# Patient Record
Sex: Female | Born: 1960 | Race: White | Hispanic: No | Marital: Married | Smoking: Never smoker
Health system: Southern US, Community
[De-identification: ages and names within clinical notes are randomized; demographics above are authoritative.]

## PROBLEM LIST (undated history)

## (undated) DIAGNOSIS — E119 Type 2 diabetes mellitus without complications: Secondary | ICD-10-CM

## (undated) DIAGNOSIS — M1990 Unspecified osteoarthritis, unspecified site: Secondary | ICD-10-CM

## (undated) DIAGNOSIS — T8859XA Other complications of anesthesia, initial encounter: Secondary | ICD-10-CM

## (undated) DIAGNOSIS — R0600 Dyspnea, unspecified: Secondary | ICD-10-CM

## (undated) DIAGNOSIS — Z87442 Personal history of urinary calculi: Secondary | ICD-10-CM

## (undated) DIAGNOSIS — J45909 Unspecified asthma, uncomplicated: Secondary | ICD-10-CM

## (undated) DIAGNOSIS — F32A Depression, unspecified: Secondary | ICD-10-CM

## (undated) HISTORY — PX: TUBAL LIGATION: SHX77

## (undated) HISTORY — PX: JOINT REPLACEMENT: SHX530

---
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
## 2008-01-12 ENCOUNTER — Ambulatory Visit: Payer: Self-pay | Admitting: Internal Medicine

## 2008-01-12 DIAGNOSIS — J309 Allergic rhinitis, unspecified: Secondary | ICD-10-CM | POA: Insufficient documentation

## 2008-01-12 DIAGNOSIS — R519 Headache, unspecified: Secondary | ICD-10-CM | POA: Insufficient documentation

## 2008-01-12 DIAGNOSIS — J45909 Unspecified asthma, uncomplicated: Secondary | ICD-10-CM | POA: Insufficient documentation

## 2008-01-12 DIAGNOSIS — R51 Headache: Secondary | ICD-10-CM | POA: Insufficient documentation

## 2008-01-18 ENCOUNTER — Ambulatory Visit: Payer: Self-pay | Admitting: Internal Medicine

## 2008-01-18 LAB — CONVERTED CEMR LAB
Basophils Relative: 0.5 % (ref 0.0–1.0)
Eosinophils Absolute: 0.2 10*3/uL (ref 0.0–0.7)
Eosinophils Relative: 2.7 % (ref 0.0–5.0)
HCT: 33.7 % — ABNORMAL LOW (ref 36.0–46.0)
MCV: 81.7 fL (ref 78.0–100.0)
Monocytes Absolute: 0.7 10*3/uL (ref 0.1–1.0)
Monocytes Relative: 8.9 % (ref 3.0–12.0)
Neutrophils Relative %: 72.8 % (ref 43.0–77.0)
RBC: 4.12 M/uL (ref 3.87–5.11)
WBC: 8.4 10*3/uL (ref 4.5–10.5)

## 2008-03-09 ENCOUNTER — Ambulatory Visit: Payer: Self-pay | Admitting: Internal Medicine

## 2008-03-09 ENCOUNTER — Encounter: Payer: Self-pay | Admitting: Internal Medicine

## 2008-03-31 ENCOUNTER — Telehealth (INDEPENDENT_AMBULATORY_CARE_PROVIDER_SITE_OTHER): Payer: Self-pay | Admitting: *Deleted

## 2008-04-05 ENCOUNTER — Telehealth (INDEPENDENT_AMBULATORY_CARE_PROVIDER_SITE_OTHER): Payer: Self-pay | Admitting: *Deleted

## 2008-04-12 ENCOUNTER — Telehealth: Payer: Self-pay | Admitting: Internal Medicine

## 2008-05-17 ENCOUNTER — Ambulatory Visit: Payer: Self-pay | Admitting: Internal Medicine

## 2008-07-18 ENCOUNTER — Telehealth (INDEPENDENT_AMBULATORY_CARE_PROVIDER_SITE_OTHER): Payer: Self-pay | Admitting: *Deleted

## 2008-10-28 ENCOUNTER — Ambulatory Visit: Payer: Self-pay | Admitting: Internal Medicine

## 2008-10-28 ENCOUNTER — Encounter: Payer: Self-pay | Admitting: Adult Health

## 2008-11-18 ENCOUNTER — Ambulatory Visit: Payer: Self-pay | Admitting: Internal Medicine

## 2008-12-06 ENCOUNTER — Telehealth (INDEPENDENT_AMBULATORY_CARE_PROVIDER_SITE_OTHER): Payer: Self-pay

## 2009-08-05 HISTORY — PX: ENDOVEIN HARVEST OF GREATER SAPHENOUS VEIN: SHX5059

## 2009-08-17 ENCOUNTER — Telehealth (INDEPENDENT_AMBULATORY_CARE_PROVIDER_SITE_OTHER): Payer: Self-pay | Admitting: *Deleted

## 2009-12-19 ENCOUNTER — Ambulatory Visit: Payer: Self-pay | Admitting: Internal Medicine

## 2010-01-25 ENCOUNTER — Telehealth (INDEPENDENT_AMBULATORY_CARE_PROVIDER_SITE_OTHER): Payer: Self-pay | Admitting: *Deleted

## 2010-09-04 NOTE — Progress Notes (Signed)
Summary: PRESCRIPT  Phone Note Call from Patient   Caller: Patient Call For: YOUNG Summary of Call: NEED PRESCRIPT FOR ADVAIR 100/50 [facility] [place] Initial call taken by: Rickard Patience,  August 17, 2009 10:24 AM  Follow-up for Phone Call        rx was sent electronic by Florentina Addison, verefied with pharmacy that they have refills for pt Follow-up by: Philipp Deputy CMA,  August 17, 2009 10:26 AM

## 2010-09-04 NOTE — Progress Notes (Signed)
Summary: clarification on meds  Phone Note From Pharmacy Call back at [phone]   Caller: Patient Call For: young Caller: [facility] Call For: young  Summary of Call: need clarification on flovent and advair Initial call taken by: Rickard Patience,  January 25, 2010 2:47 PM  Follow-up for Phone Call        [facility], spoke with Homero Fellers.  Requesting clarrification on advair and flovent.  ? if pt is supposed to be on both of these or just one.  Will forward message to CY-pls advise.  Thanks! Gweneth Dimitri RN  January 25, 2010 3:18 PM   Additional Follow-up for Phone Call Additional follow up Details #1::        Intent was to switch to Flovent. don't need to fill Advair now, but we were going to keep it on our med list as a default that she could get on short notice if flovent won't hold her. If she wants to fill both that is ok if allowed by insurance, but I won't do a prior auth to get both. Additional Follow-up by: Waymon Budge MD,  January 25, 2010 8:10 PM    Additional Follow-up for Phone Call Additional follow up Details #2::    Spoke with pharmacist and advised of the above recs per CDY.  He verbalized understanding and will delete the adviar from med list. Follow-up by: Vernie Murders,  January 26, 2010 9:22 AM
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
## 2010-12-13 ENCOUNTER — Telehealth: Payer: Self-pay | Admitting: Internal Medicine

## 2010-12-13 MED ORDER — FLUTICASONE PROPIONATE 50 MCG/ACT NA SUSP: NASAL | Status: DC

## 2010-12-13 NOTE — Telephone Encounter (Signed)
Per CY-okay to change. 

## 2010-12-13 NOTE — Telephone Encounter (Signed)
Dr Maple Hudson, is it okay to switch the nasonex to something generic like fluticasone? Pls advise thanks!

## 2010-12-13 NOTE — Telephone Encounter (Signed)
Pharmacist notified okay to change to generic flonase.

## 2011-01-13 ENCOUNTER — Other Ambulatory Visit: Payer: Self-pay | Admitting: Internal Medicine

## 2011-05-16 ENCOUNTER — Other Ambulatory Visit: Payer: Self-pay | Admitting: Internal Medicine

## 2011-06-16 ENCOUNTER — Other Ambulatory Visit: Payer: Self-pay | Admitting: Internal Medicine

## 2011-07-13 ENCOUNTER — Other Ambulatory Visit: Payer: Self-pay | Admitting: Internal Medicine

## 2011-09-08 ENCOUNTER — Other Ambulatory Visit: Payer: Self-pay | Admitting: Internal Medicine

## 2011-09-11 ENCOUNTER — Other Ambulatory Visit: Payer: Self-pay | Admitting: Allergy

## 2011-09-11 MED ORDER — FLUTICASONE-SALMETEROL 100-50 MCG/DOSE IN AEPB: 1.0000 | INHALATION_SPRAY | Freq: Once | RESPIRATORY_TRACT | Status: DC

## 2011-10-13 ENCOUNTER — Other Ambulatory Visit: Payer: Self-pay | Admitting: Internal Medicine

## 2020-02-14 ENCOUNTER — Encounter: Payer: Self-pay | Admitting: Neurology

## 2020-04-24 DIAGNOSIS — E66812 Obesity, class 2: Secondary | ICD-10-CM | POA: Insufficient documentation

## 2020-04-24 DIAGNOSIS — Z6837 Body mass index (BMI) 37.0-37.9, adult: Secondary | ICD-10-CM | POA: Insufficient documentation

## 2020-05-10 DIAGNOSIS — R29898 Other symptoms and signs involving the musculoskeletal system: Secondary | ICD-10-CM | POA: Insufficient documentation

## 2020-05-10 DIAGNOSIS — E1149 Type 2 diabetes mellitus with other diabetic neurological complication: Secondary | ICD-10-CM | POA: Insufficient documentation

## 2020-05-15 ENCOUNTER — Ambulatory Visit: Payer: Self-pay | Admitting: Neurology

## 2020-07-10 DIAGNOSIS — G5722 Lesion of femoral nerve, left lower limb: Secondary | ICD-10-CM | POA: Insufficient documentation

## 2021-03-12 ENCOUNTER — Ambulatory Visit: Payer: Medicare PPO | Admitting: Orthopedic Surgery

## 2021-03-12 ENCOUNTER — Other Ambulatory Visit: Payer: Self-pay

## 2021-03-12 ENCOUNTER — Ambulatory Visit (INDEPENDENT_AMBULATORY_CARE_PROVIDER_SITE_OTHER): Payer: Medicare PPO

## 2021-03-12 DIAGNOSIS — M25561 Pain in right knee: Secondary | ICD-10-CM

## 2021-03-12 DIAGNOSIS — M25562 Pain in left knee: Secondary | ICD-10-CM | POA: Diagnosis not present

## 2021-03-19 ENCOUNTER — Encounter: Payer: Self-pay | Admitting: Orthopedic Surgery

## 2021-03-19 NOTE — Progress Notes (Signed)
Office Visit Note   Patient: Deborah Bolton           Date of Birth: [birth year]           MRN: [id number] Visit Date: 03/12/2021 Requested by: Hortencia Conradi, NP [address] [place],  [place] [address] PCP: Hortencia Conradi, NP  Subjective: Chief Complaint  Patient presents with   Right Knee - Pain    HPI: Deborah Bolton is a 60 year old patient with right knee pain.  She also has a known history of right hip arthritis.  Recently diagnosed with diabetes in July 2021.  She has tried cortisone injections which have not helped her right knee.  She sees a nutritionist later this week.  She does take Celebrex for her hip.  She wants to exercise but can only walk a short distance.  She had a left total hip replacement which had some complicating issues related to sensory nerve.  Her husband is in hospice with end-stage liver disease.  [place]  Has recently had a thyroidectomy.  She has lost 25 pounds.  She also has a history of recurrent E. coli infections which have required PICC line at times.  She uses a walker for long distance walking.  States that her knee hurts more than her right hip.              ROS: All systems reviewed are negative as they relate to the chief complaint within the history of present illness.  Patient denies  fevers or chills.   Assessment & Plan: Visit Diagnoses:  1. Pain in both knees, unspecified chronicity     Plan: Impression is end-stage right knee arthritis.  She has a lot of other social issues going on right now.  Risk and benefits of total knee replacement are discussed with the patient.  Expected rehabilitation time also discussed.  On exam I do think that her knee is giving her more symptoms in the hip at this time.  She is going to consider her options and try to get a little bit more stability in her social life prior to undertaking large elective surgery of knee replacement.  I think she will also need hip replacement at sometime in the  future.  Follow-Up Instructions: No follow-ups on file.   Orders:  Orders Placed This Encounter  Procedures   XR Knee 1-2 Views Right   No orders of the defined types were placed in this encounter.     Procedures: No procedures performed   Clinical Data: No additional findings.  Objective: Vital Signs: There were no vitals taken for this visit.  Physical Exam:   Constitutional: Patient appears well-developed HEENT:  Head: Normocephalic Eyes:EOM are normal Neck: Normal range of motion Cardiovascular: Normal rate Pulmonary/chest: Effort normal Neurologic: Patient is alert Skin: Skin is warm Psychiatric: Patient has normal mood and affect   Ortho Exam: Ortho exam demonstrates full active and passive range of motion of the ankles.  Right knee has range of motion of about 5 to 100 degrees.  Mild effusion present.  Collateral and cruciate ligaments are stable.  Mild groin pain with diminished range of motion of the right hip is present.  Hip flexion strength intact.  Pedal pulses palpable.  Collateral crucial ligaments stable in the knee.  Specialty Comments:  No specialty comments available.  Imaging: No results found.   PMFS History: Patient Active Problem List   Diagnosis Date Noted   ALLERGIC RHINITIS 01/12/2008  ASTHMA 01/12/2008   HEADACHE, CHRONIC 01/12/2008   History reviewed. No pertinent past medical history.  History reviewed. No pertinent family history.  History reviewed. No pertinent surgical history. Social History   Occupational History   Not on file  Tobacco Use   Smoking status: Not on file   Smokeless tobacco: Not on file  Substance and Sexual Activity   Alcohol use: Not on file   Drug use: Not on file   Sexual activity: Not on file

## 2021-08-23 ENCOUNTER — Ambulatory Visit: Payer: Medicare PPO | Admitting: Orthopaedic Surgery

## 2021-08-23 ENCOUNTER — Ambulatory Visit (INDEPENDENT_AMBULATORY_CARE_PROVIDER_SITE_OTHER): Payer: Medicare PPO

## 2021-08-23 ENCOUNTER — Other Ambulatory Visit: Payer: Self-pay

## 2021-08-23 VITALS — Ht 63.75 in | Wt 227.8 lb

## 2021-08-23 DIAGNOSIS — M25551 Pain in right hip: Secondary | ICD-10-CM | POA: Diagnosis not present

## 2021-08-23 DIAGNOSIS — M1611 Unilateral primary osteoarthritis, right hip: Secondary | ICD-10-CM | POA: Diagnosis not present

## 2021-08-23 NOTE — Progress Notes (Signed)
Office Visit Note   Patient: Deborah Bolton           Date of Birth: 17-Nov-1960           MRN: [id number] Visit Date: 08/23/2021              Requested by: Zoila Shutter, NP [place],  [place] [address] PCP: Zoila Shutter, NP   Assessment & Plan: Visit Diagnoses:  1. Primary osteoarthritis of right hip     Plan: Alaxandra is a very pleasant female who is a referral from Dr. Marlou Sa for chronic right hip pain due to advanced DJD.  She ambulates with a cane.  She is unable to go to the mall as a result.  She is very limited in her mobility and has had a significant decline in her quality of life.  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  She has weakness in quad function but she is able to ambulate.  Celebrex and cane only help temporarily.    Examination of the right hip shows limited ROM due to pain and guarding.  Antalgic gait.  Pain with logroll, FADIR and stinchfield.  Abdominal pannus present.    Impression is end stage right hip DJD.  At this point, she can no longer manage the pain with just conservative treatments.  Based on her options, she has elected to move forward with a right hip replacement in the near future.  I do feel that an incisional VAC is indicated due to overhanging abdominal pannus.  We reviewed risks, benefits, prognosis of the surgery.  Questions answered.  We will obtain necessary medical clearance from Irven Shelling, her PCP.  Follow-Up Instructions: No follow-ups on file.   Orders:  Orders Placed This Encounter  Procedures   XR HIP UNILAT W OR W/O PELVIS 2-3 VIEWS RIGHT   No orders of the defined types were placed in this encounter.     Procedures: No procedures performed   Clinical Data: No additional findings.   Subjective: Chief Complaint  Patient presents with   Right Hip - Pain     HPI  Review of Systems  Constitutional: Negative.   HENT: Negative.    Eyes: Negative.   Respiratory: Negative.    Cardiovascular: Negative.   Endocrine: Negative.   Musculoskeletal: Negative.   Neurological: Negative.   Hematological: Negative.   Psychiatric/Behavioral: Negative.    All other systems reviewed and are negative.   Objective: Vital Signs: Ht 5' 3.75" (1.619 m)    Wt 227 lb 12.8 oz (103.3 kg)    BMI 39.41 kg/m   Physical Exam Vitals and nursing note reviewed.  Constitutional:      Appearance: She is well-developed.  Pulmonary:     Effort: Pulmonary effort is normal.  Skin:    General: Skin is warm.     Capillary Refill: Capillary refill takes less than 2 seconds.  Neurological:     Mental Status: She is alert and oriented to person, place, and time.  Psychiatric:        Behavior: Behavior normal.        Thought Content: Thought content normal.        Judgment: Judgment normal.    Ortho Exam  Specialty Comments:  No specialty comments available.  Imaging: XR HIP UNILAT W OR W/O PELVIS 2-3 VIEWS  RIGHT  Result Date: 08/23/2021 Stable left total hip replacement without complication.  Advanced DJD of the right hip joint.     PMFS History: Patient Active Problem List   Diagnosis Date Noted   ALLERGIC RHINITIS 01/12/2008   ASTHMA 01/12/2008   HEADACHE, CHRONIC 01/12/2008   No past medical history on file.  No family history on file.  No past surgical history on file. Social History   Occupational History   Not on file  Tobacco Use   Smoking status: Not on file   Smokeless tobacco: Not on file  Substance and Sexual Activity   Alcohol use: Not on file   Drug use: Not on file   Sexual activity: Not on file

## 2021-08-30 ENCOUNTER — Other Ambulatory Visit: Payer: Self-pay

## 2021-09-25 ENCOUNTER — Encounter: Payer: Medicare PPO | Admitting: Orthopaedic Surgery

## 2021-10-01 ENCOUNTER — Other Ambulatory Visit: Payer: Self-pay | Admitting: Physician Assistant

## 2021-10-01 MED ORDER — METHOCARBAMOL 500 MG PO TABS: 500.0000 mg | ORAL_TABLET | Freq: Two times a day (BID) | ORAL | 2 refills | Status: DC | PRN

## 2021-10-01 MED ORDER — OXYCODONE-ACETAMINOPHEN 5-325 MG PO TABS: 1.0000 | ORAL_TABLET | Freq: Four times a day (QID) | ORAL | 0 refills | Status: DC | PRN

## 2021-10-01 MED ORDER — DOCUSATE SODIUM 100 MG PO CAPS: 100.0000 mg | ORAL_CAPSULE | Freq: Every day | ORAL | 2 refills | Status: DC | PRN

## 2021-10-01 MED ORDER — ASPIRIN EC 81 MG PO TBEC: 81.0000 mg | DELAYED_RELEASE_TABLET | Freq: Two times a day (BID) | ORAL | 0 refills | Status: DC

## 2021-10-01 MED ORDER — ONDANSETRON HCL 4 MG PO TABS: 4.0000 mg | ORAL_TABLET | Freq: Three times a day (TID) | ORAL | 0 refills | Status: DC | PRN

## 2021-10-01 MED ORDER — SULFAMETHOXAZOLE-TRIMETHOPRIM 800-160 MG PO TABS: 1.0000 | ORAL_TABLET | Freq: Two times a day (BID) | ORAL | 0 refills | Status: DC

## 2021-10-03 NOTE — Progress Notes (Signed)
Surgical Instructions    Your procedure is scheduled on Monday March 6.  Report to [facility] Main Entrance "A" at 9:50 A.M., then check in with the Admitting office.  Call this number if you have problems the morning of surgery:  [phone]   If you have any questions prior to your surgery date call [phone]: Open Monday-Friday 8am-4pm    Remember:  Do not eat after midnight the night before your surgery  You may drink clear liquids until 8:50am the morning of your surgery.   Clear liquids allowed are: Water, Non-Citrus Juices (without pulp), Carbonated Beverages, Clear Tea, Black Coffee ONLY (NO MILK, CREAM OR POWDERED CREAMER of any kind), and Gatorade.  Patient Instructions  The night before surgery:  No food after midnight. ONLY clear liquids after midnight  The day of surgery (if you have diabetes): Drink ONE (1) 12 oz G2 given to you in your pre admission testing appointment by 8:50 am the morning of surgery. Drink in one sitting. Do not sip.  This drink was given to you during your hospital  pre-op appointment visit.  Nothing else to drink after completing the  12 oz bottle of G2.         If you have questions, please contact your surgeons office.      Take these medicines the morning of surgery with A SIP OF WATER:  oxybutynin (DITROPAN XL)                           If needed,take: albuterol (VENTOLIN HFA)inhaler fluticasone (FLONASE)  methocarbamol (ROBAXIN) ondansetron (ZOFRAN)  As of today, STOP taking any Aspirin (unless otherwise instructed by your surgeon) celecoxib (CELEBREX), Aleve, Naproxen, Ibuprofen, Motrin, Advil, Goody's, BC's, all herbal medications, fish oil, and all vitamins.   WHAT DO I DO ABOUT MY DIABETES MEDICATION?   Do not take oral diabetes medicines (pills) the morning of surgery.  DO NOT take metFORMIN (GLUCOPHAGE) the morning of surgery.      The day of surgery, do not take other diabetes injectables, including Byetta  (exenatide), Bydureon (exenatide ER), Victoza (liraglutide), or Trulicity (dulaglutide)  HOW TO MANAGE YOUR DIABETES BEFORE AND AFTER SURGERY  Why is it important to control my blood sugar before and after surgery? Improving blood sugar levels before and after surgery helps healing and can limit problems. A way of improving blood sugar control is eating a healthy diet by:  Eating less sugar and carbohydrates  Increasing activity/exercise  Talking with your doctor about reaching your blood sugar goals High blood sugars (greater than 180 mg/dL) can raise your risk of infections and slow your recovery, so you will need to focus on controlling your diabetes during the weeks before surgery. Make sure that the doctor who takes care of your diabetes knows about your planned surgery including the date and location.  How do I manage my blood sugar before surgery? Check your blood sugar at least 4 times a day, starting 2 days before surgery, to make sure that the level is not too high or low.  Check your blood sugar the morning of your surgery when you wake up and every 2 hours until you get to the Short Stay unit.  If your blood sugar is less than 70 mg/dL, you will need to treat for low blood sugar: Do not take insulin. Treat a low blood sugar (less than 70 mg/dL) with  cup of clear juice (cranberry or apple), 4 glucose  tablets, OR glucose gel. Recheck blood sugar in 15 minutes after treatment (to make sure it is greater than 70 mg/dL). If your blood sugar is not greater than 70 mg/dL on recheck, call [phone] for further instructions. Report your blood sugar to the short stay nurse when you get to Short Stay.  If you are admitted to the hospital after surgery: Your blood sugar will be checked by the staff and you will probably be given insulin after surgery (instead of oral diabetes medicines) to make sure you have good blood sugar levels. The goal for blood sugar control after surgery is  80-180 mg/dL.           Do not wear jewelry or makeup Do not wear lotions, powders, perfumes/colognes, or deodorant. Do not shave 48 hours prior to surgery.  Men may shave face and neck. Do not bring valuables to the hospital. Do not wear nail polish, gel polish, artificial nails, or any other type of covering on natural nails (fingers and toes) If you have artificial nails or gel coating that need to be removed by a nail salon, please have this removed prior to surgery. Artificial nails or gel coating may interfere with anesthesia's ability to adequately monitor your vital signs.  [facility] is not responsible for any belongings or valuables. .   Do NOT Smoke (Tobacco/Vaping)  24 hours prior to your procedure  If you use a CPAP at night, you may bring your mask for your overnight stay.   Contacts, glasses, hearing aids, dentures or partials may not be worn into surgery, please bring cases for these belongings   For patients admitted to the hospital, discharge time will be determined by your treatment team.   Patients discharged the day of surgery will not be allowed to drive home, and someone needs to stay with them for 24 hours.  NO VISITORS WILL BE ALLOWED IN PRE-OP WHERE PATIENTS ARE PREPPED FOR SURGERY.  ONLY 1 SUPPORT PERSON MAY BE PRESENT IN THE WAITING ROOM WHILE YOU ARE IN SURGERY.  IF YOU ARE TO BE ADMITTED, ONCE YOU ARE IN YOUR ROOM YOU WILL BE ALLOWED TWO (2) VISITORS. 1 (ONE) VISITOR MAY STAY OVERNIGHT BUT MUST ARRIVE TO THE ROOM BY 8pm.  Minor children may have two parents present. Special consideration for safety and communication needs will be reviewed on a case by case basis.  Special instructions:    Oral Hygiene is also important to reduce your risk of infection.  Remember - BRUSH YOUR TEETH THE MORNING OF SURGERY WITH YOUR REGULAR TOOTHPASTE   [facility]- Preparing For Surgery  Before surgery, you can play an important role. Because skin is not sterile, your skin  needs to be as free of germs as possible. You can reduce the number of germs on your skin by washing with CHG (chlorahexidine gluconate) Soap before surgery.  CHG is an antiseptic cleaner which kills germs and bonds with the skin to continue killing germs even after washing.     Please do not use if you have an allergy to CHG or antibacterial soaps. If your skin becomes reddened/irritated stop using the CHG.  Do not shave (including legs and underarms) for at least 48 hours prior to first CHG shower. It is OK to shave your face.  Please follow these instructions carefully.     Shower the NIGHT BEFORE SURGERY and the MORNING OF SURGERY with CHG Soap.   If you chose to wash your hair, wash your hair first  as usual with your normal shampoo. After you shampoo, rinse your hair and body thoroughly to remove the shampoo.  Then [employer] and genitals (private parts) with your normal soap and rinse thoroughly to remove soap.  After that Use CHG Soap as you would any other liquid soap. You can apply CHG directly to the skin and wash gently with a scrungie or a clean washcloth.   Apply the CHG Soap to your body ONLY FROM THE NECK DOWN.  Do not use on open wounds or open sores. Avoid contact with your eyes, ears, mouth and genitals (private parts). Wash Face and genitals (private parts)  with your normal soap.   Wash thoroughly, paying special attention to the area where your surgery will be performed.  Thoroughly rinse your body with warm water from the neck down.  DO NOT shower/wash with your normal soap after using and rinsing off the CHG Soap.  Pat yourself dry with a CLEAN TOWEL.  Wear CLEAN PAJAMAS to bed the night before surgery  Place CLEAN SHEETS on your bed the night before your surgery  DO NOT SLEEP WITH PETS.   Day of Surgery:  Take a shower with CHG soap. Wear Clean/Comfortable clothing the morning of surgery Do not apply any deodorants/lotions.   Remember to brush your teeth  WITH YOUR REGULAR TOOTHPASTE.    COVID testing  If you are going to stay overnight or be admitted after your procedure/surgery and require a pre-op COVID test, please follow these instructions after your COVID test   You are not required to quarantine however you are required to wear a well-fitting mask when you are out and around people not in your household.  If your mask becomes wet or soiled, replace with a new one.  Wash your hands often with soap and water for 20 seconds or clean your hands with an alcohol-based hand sanitizer that contains at least 60% alcohol.  Do not share personal items.  Notify your provider: if you are in close contact with someone who has COVID  or if you develop a fever of 100.4 or greater, sneezing, cough, sore throat, shortness of breath or body aches.    Please read over the following fact sheets that you were given.

## 2021-10-04 ENCOUNTER — Encounter (HOSPITAL_COMMUNITY)
Admission: RE | Admit: 2021-10-04 | Discharge: 2021-10-04 | Disposition: A | Discharge status: Home / Self Care | Payer: Medicare PPO | Source: Ambulatory Visit | Attending: Orthopaedic Surgery | Admitting: Orthopaedic Surgery

## 2021-10-04 ENCOUNTER — Other Ambulatory Visit: Payer: Self-pay

## 2021-10-04 ENCOUNTER — Encounter (HOSPITAL_COMMUNITY): Payer: Self-pay

## 2021-10-04 VITALS — BP 154/100 | HR 75 | Temp 98.4°F | Resp 17 | Ht 65.0 in | Wt 229.8 lb

## 2021-10-04 DIAGNOSIS — E119 Type 2 diabetes mellitus without complications: Secondary | ICD-10-CM | POA: Insufficient documentation

## 2021-10-04 DIAGNOSIS — J45909 Unspecified asthma, uncomplicated: Secondary | ICD-10-CM | POA: Insufficient documentation

## 2021-10-04 DIAGNOSIS — M1611 Unilateral primary osteoarthritis, right hip: Secondary | ICD-10-CM | POA: Insufficient documentation

## 2021-10-04 DIAGNOSIS — Z01812 Encounter for preprocedural laboratory examination: Secondary | ICD-10-CM | POA: Diagnosis not present

## 2021-10-04 DIAGNOSIS — Z6838 Body mass index (BMI) 38.0-38.9, adult: Secondary | ICD-10-CM | POA: Insufficient documentation

## 2021-10-04 DIAGNOSIS — E669 Obesity, unspecified: Secondary | ICD-10-CM | POA: Diagnosis not present

## 2021-10-04 DIAGNOSIS — Z01818 Encounter for other preprocedural examination: Secondary | ICD-10-CM

## 2021-10-04 HISTORY — DX: Unspecified osteoarthritis, unspecified site: M19.90

## 2021-10-04 HISTORY — DX: Type 2 diabetes mellitus without complications: E11.9

## 2021-10-04 HISTORY — DX: Other complications of anesthesia, initial encounter: T88.59XA

## 2021-10-04 HISTORY — DX: Personal history of urinary calculi: Z87.442

## 2021-10-04 HISTORY — DX: Dyspnea, unspecified: R06.00

## 2021-10-04 HISTORY — DX: Depression, unspecified: F32.A

## 2021-10-04 HISTORY — DX: Unspecified asthma, uncomplicated: J45.909

## 2021-10-04 LAB — COMPREHENSIVE METABOLIC PANEL
ALT: 21 U/L (ref 0–44)
AST: 21 U/L (ref 15–41)
Albumin: 3.7 g/dL (ref 3.5–5.0)
Alkaline Phosphatase: 83 U/L (ref 38–126)
Anion gap: 8 (ref 5–15)
BUN: 16 mg/dL (ref 8–23)
CO2: 26 mmol/L (ref 22–32)
Calcium: 9.2 mg/dL (ref 8.9–10.3)
Chloride: 106 mmol/L (ref 98–111)
Creatinine, Ser: 0.82 mg/dL (ref 0.44–1.00)
GFR, Estimated: >60 mL/min (ref >60)
Glucose, Bld: 101 mg/dL — ABNORMAL HIGH (ref 70–99)
Potassium: 4.3 mmol/L (ref 3.5–5.1)
Sodium: 140 mmol/L (ref 135–145)
Total Bilirubin: 0.9 mg/dL (ref 0.3–1.2)
Total Protein: 6.8 g/dL (ref 6.5–8.1)

## 2021-10-04 LAB — CBC WITH DIFFERENTIAL/PLATELET
Abs Immature Granulocytes: 0.05 10*3/uL (ref 0.00–0.07)
Basophils Absolute: 0.1 10*3/uL (ref 0.0–0.1)
Basophils Relative: 1 %
Eosinophils Absolute: 0.3 10*3/uL (ref 0.0–0.5)
Eosinophils Relative: 4 %
HCT: 39.5 % (ref 36.0–46.0)
Hemoglobin: 12.8 g/dL (ref 12.0–15.0)
Immature Granulocytes: 1 %
Lymphocytes Relative: 26 %
Lymphs Abs: 2.2 10*3/uL (ref 0.7–4.0)
MCH: 27.9 pg (ref 26.0–34.0)
MCHC: 32.4 g/dL (ref 30.0–36.0)
MCV: 86.2 fL (ref 80.0–100.0)
Monocytes Absolute: 0.7 10*3/uL (ref 0.1–1.0)
Monocytes Relative: 8 %
Neutro Abs: 5 10*3/uL (ref 1.7–7.7)
Neutrophils Relative %: 60 %
Platelets: 293 10*3/uL (ref 150–400)
RBC: 4.58 MIL/uL (ref 3.87–5.11)
RDW: 15.4 % (ref 11.5–15.5)
WBC: 8.3 10*3/uL (ref 4.0–10.5)
nRBC: 0 % (ref 0.0–0.2)

## 2021-10-04 LAB — TYPE AND SCREEN
ABO/RH(D): O NEG
Antibody Screen: NEGATIVE
Weak D: POSITIVE

## 2021-10-04 LAB — HEMOGLOBIN A1C
Hgb A1c MFr Bld: 5.9 % — ABNORMAL HIGH (ref 4.8–5.6)
Mean Plasma Glucose: 122.63 mg/dL

## 2021-10-04 LAB — SURGICAL PCR SCREEN
MRSA, PCR: NEGATIVE
Staphylococcus aureus: NEGATIVE

## 2021-10-04 LAB — GLUCOSE, CAPILLARY: Glucose-Capillary: 98 mg/dL (ref 70–99)

## 2021-10-04 NOTE — Progress Notes (Addendum)
PCP: Clelia Croft, NP in [place] ?Cardiologist: denies ? ?EKG: 09/13/21 at PCP office.  Requested tracing ?CXR: na ?ECHO: denies ?Stress Test: denies ?Cardiac Cath: denies ? ?Fasting Blood Sugar- does not check glucose ?Checks Blood Sugar_0__ times a day ? ?OSA/CPAP: No ? ?ASA: for after surgery ?Blood Thinner: No ? ?Covid test positive in 08/30/21. Requested result from PCP. ? ?Anesthesia Review: No ? ?Patient denies shortness of breath, fever, cough, and chest pain at PAT appointment. ? ?Patient verbalized understanding of instructions provided today at the PAT appointment.  Patient asked to review instructions at home and day of surgery.   ?

## 2021-10-05 ENCOUNTER — Encounter (HOSPITAL_COMMUNITY): Payer: Self-pay

## 2021-10-05 MED ORDER — TRANEXAMIC ACID 1000 MG/10ML IV SOLN
2000.0000 mg | INTRAVENOUS | Status: DC
  Filled 2021-10-05: qty 20

## 2021-10-05 NOTE — Progress Notes (Addendum)
Anesthesia Chart Review: ? Case: [id number] Date/Time: 10/08/21 1136  ? Procedure: RIGHT TOTAL HIP ARTHROPLASTY ANTERIOR APPROACH, INCISIONAL VAC (Right: Hip) - C-3  ? Anesthesia type: Spinal  ? Pre-op diagnosis: right hip degenerative joint disease  ? Location: [facility] ROOM 04 / [facility] OR  ? Surgeons: Tarry Kos, MD  ? ?  ? ? ?DISCUSSION: Patient is a 61 year old female scheduled for the above procedure. ? 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 BMI is consistent with obesity. Says she is "sensitive" to sedating medications.  ? ?Medical clearance from PCP Hortencia Conradi, NP received signed on 09/10/21. He medically cleared but recommended an EKG and echocardiogram, reportedly for DOE with cough (but patient said in the setting of when she had acute diagnosis of COVID 08/30/21, s/p Paxlovid). EKG done at his office on 09/12/21 showed SR, LAD, low voltage. ? ?She had an echo on 10/05/21 at [facility]. Staff there are aware the surgery is scheduled for 10/08/21 and priority read requested. They have PAT Fax number to fax the report to [facility]. I called their office at 10/05/21 4:22 PM, and the report was not back yet. I called patient to discuss that if echo was not received by the end of business day on 10/05/21, then echo would have to be reviewed by anesthesia team on the day of surgery. If any concerning findings then it's possible her surgery could be cancelled. She denied any acute cardiac symptoms. She does report she uses albuterol nebulizer for asthma and has been using regularly since COVID in order to optimize herself for surgery. No conversational dyspnea or audible wheezing noted.  ? ?Positive COVID-19 test on 08/30/21 at [facility]. Copy on chart.  ? ?Anesthesia team to evaluate on the day of surgery and follow-up on 10/05/21 echo results. ? ?ADDENDUM 10/08/21 9:05 AM: ?10/06/19 Echo  results received showing normal LV systolic function, EF 65-70%, moderate basal septal hypertrophy, normal RVSF, trace MR/TR. ? ? ?VS: BP (!) 154/100   Pulse 75   Temp 36.9 ?C (Oral)   Resp 17   Ht 5\' 5"  (1.651 m)   Wt 104.2 kg   SpO2 97%   BMI 38.24 kg/m?  ?Says BP usually ~ 130/80 at highest--said she was in pain after walk to PAT.  ? ?PROVIDERS: ? , NP is PCP [facility]) ? ? ?LABS: Labs reviewed: Acceptable for surgery. ?(all labs ordered are listed, but only abnormal results are displayed) ? ?Labs Reviewed  ?COMPREHENSIVE METABOLIC PANEL - Abnormal; Notable for the following components:  ?    Result Value  ? Glucose, Bld 101 (*)   ? All other components within normal limits  ?HEMOGLOBIN A1C - Abnormal; Notable for the following components:  ? Hgb A1c MFr Bld 5.9 (*)   ? All other components within normal limits  ?SURGICAL PCR SCREEN  ?GLUCOSE, CAPILLARY  ?CBC WITH DIFFERENTIAL/PLATELET  ?TYPE AND SCREEN  ? ? ?EKG: 09/13/21 ([facility]): SR. LAD. Low voltage QRS.  ? ? ?CV: ?Echo 10/05/21 ([facility]): Report says study date of 10/08/21, but study was done on 10/05/21 at 7:30 AM. ?Conclusions: ?Normal left ventricular size ?Normal left ventricular systolic function. ?Ejection fraction is visually estimated at 65-70%. ?Moderate basal septal hypertrophy noted. ?Indeterminate diastolic function. ?Normal right ventricular size and systolic function. ?Mildly dilated left atrium. ?Mild mitral annular calcification noted. ?Trace mild  atrial regurgitation. ?Trace tricuspid regurgitation. ? ? ?Past Medical History:  ?Diagnosis Date  ? Arthritis   ? Asthma   ? Complication of anesthesia   ? says she is "sensitive" to sedating medications  ? Depression   ? Diabetes mellitus without complication (HCC)   ? Dyspnea   ? History of kidney stones   ? ? ?Past Surgical History:  ?Procedure Laterality Date  ? JOINT REPLACEMENT    ? TUBAL LIGATION    ? ? ?MEDICATIONS: ? aspirin EC 81 MG tablet  ? docusate  sodium (COLACE) 100 MG capsule  ? methocarbamol (ROBAXIN) 500 MG tablet  ? ondansetron (ZOFRAN) 4 MG tablet  ? oxyCODONE-acetaminophen (PERCOCET) 5-325 MG tablet  ? sulfamethoxazole-trimethoprim (BACTRIM DS) 800-160 MG tablet  ? albuterol (PROVENTIL) (2.5 MG/3ML) 0.083% nebulizer solution  ? albuterol (VENTOLIN HFA) 108 (90 Base) MCG/ACT inhaler  ? Ascorbic Acid (VITAMIN C) 1000 MG tablet  ? Biotin 5 MG CAPS  ? celecoxib (CELEBREX) 200 MG capsule  ? Coenzyme Q10 (COQ-10) 100 MG CAPS  ? Dulaglutide (TRULICITY) 0.75 MG/0.5ML SOPN  ? ferrous sulfate 325 (65 FE) MG tablet  ? FLUoxetine (PROZAC) 20 MG capsule  ? fluticasone (FLONASE) 50 MCG/ACT nasal spray  ? Garlic 1000 MG CAPS  ? ibuprofen (ADVIL) 200 MG tablet  ? Magnesium 500 MG TABS  ? metFORMIN (GLUCOPHAGE) 1000 MG tablet  ? Multiple Vitamin (MULTIVITAMIN WITH MINERALS) TABS tablet  ? oxybutynin (DITROPAN XL) 15 MG 24 hr tablet  ? Quercetin 500 MG CAPS  ? zinc gluconate 50 MG tablet  ? ?No current facility-administered medications for this encounter.  ? ?ASA is prescribed for post-op management. ? ? ?Shonna Chock, PA-C ?Surgical Short Stay/Anesthesiology ?[facility] Phone [phone] ?[facility] Phone [phone] ?10/05/2021 4:42 PM ? ? ? ? ? ? ?

## 2021-10-05 NOTE — Anesthesia Preprocedure Evaluation (Addendum)
Anesthesia Evaluation  ?Patient identified by MRN, date of birth, ID band ?Patient awake ? ? ? ?Reviewed: ?Allergy & Precautions, H&P , NPO status , Patient's Chart, lab work & pertinent test results ? ?Airway ?Mallampati: II ? ?TM Distance: >3 FB ?Neck ROM: Full ? ? ? Dental ?no notable dental hx. ?(+) Teeth Intact, Dental Advisory Given ?  ?Pulmonary ?asthma ,  ?  ?Pulmonary exam normal ?breath sounds clear to auscultation ? ? ? ? ? ? Cardiovascular ?negative cardio ROS ? ? ?Rhythm:Regular Rate:Normal ? ?Echo 10/05/21: ?Conclusions: ?Normal left ventricular size ?Normal left ventricular systolic function. ?Ejection fraction is visually estimated at 65-70%. ?Moderate basal septal hypertrophy noted. ?Indeterminate diastolic function. ?Normal right ventricular size and systolic function. ?Mildly dilated left atrium. ?Mild mitral annular calcification noted. ?Trace mild atrial regurgitation. ?Trace tricuspid regurgitation. ? ? ?  ?Neuro/Psych ? Headaches, Depression   ? GI/Hepatic ?negative GI ROS, Neg liver ROS,   ?Endo/Other  ?diabetes, Insulin DependentMorbid obesity ? Renal/GU ?negative Renal ROS  ?negative genitourinary ?  ?Musculoskeletal ? ?(+) Arthritis , Osteoarthritis,   ? Abdominal ?  ?Peds ? Hematology ?negative hematology ROS ?(+)   ?Anesthesia Other Findings ? ? Reproductive/Obstetrics ?negative OB ROS ? ?  ? ? ? ? ? ? ? ? ? ? ? ? ? ?  ?  ? ? ? ? ? ?Anesthesia Physical ?Anesthesia Plan ? ?ASA: 3 ? ?Anesthesia Plan: Spinal  ? ?Post-op Pain Management: Tylenol PO (pre-op)*  ? ?Induction: Intravenous ? ?PONV Risk Score and Plan: 3 and Ondansetron and Dexamethasone ? ?Airway Management Planned: Natural Airway and Simple Face Mask ? ?Additional Equipment:  ? ?Intra-op Plan:  ? ?Post-operative Plan:  ? ?Informed Consent: I have reviewed the patients History and Physical, chart, labs and discussed the procedure including the risks, benefits and alternatives for the proposed  anesthesia with the patient or authorized representative who has indicated his/her understanding and acceptance.  ? ? ? ?Dental advisory given ? ?Plan Discussed with: CRNA ? ?Anesthesia Plan Comments: (See PAT note written 10/05/2021 by Shonna Chock, PA-C. )  ? ? ? ? ?Anesthesia Quick Evaluation ? ?

## 2021-10-08 ENCOUNTER — Other Ambulatory Visit: Payer: Self-pay

## 2021-10-08 ENCOUNTER — Ambulatory Visit (HOSPITAL_COMMUNITY): Payer: Medicare PPO

## 2021-10-08 ENCOUNTER — Observation Stay (HOSPITAL_COMMUNITY)
Admission: RE | Admit: 2021-10-08 | Discharge: 2021-10-09 | Disposition: A | Discharge status: Home Health | Payer: Medicare PPO | Attending: Orthopaedic Surgery | Admitting: Orthopaedic Surgery

## 2021-10-08 ENCOUNTER — Observation Stay (HOSPITAL_COMMUNITY): Payer: Medicare PPO

## 2021-10-08 ENCOUNTER — Ambulatory Visit (HOSPITAL_COMMUNITY): Payer: Medicare PPO | Admitting: Physician Assistant

## 2021-10-08 ENCOUNTER — Ambulatory Visit (HOSPITAL_BASED_OUTPATIENT_CLINIC_OR_DEPARTMENT_OTHER): Payer: Medicare PPO | Admitting: Anesthesiology

## 2021-10-08 ENCOUNTER — Encounter (HOSPITAL_COMMUNITY)
Admission: RE | Disposition: A | Discharge status: Home Health | Payer: Self-pay | Source: Home / Self Care | Attending: Orthopaedic Surgery

## 2021-10-08 ENCOUNTER — Encounter (HOSPITAL_COMMUNITY): Payer: Self-pay | Admitting: Orthopaedic Surgery

## 2021-10-08 DIAGNOSIS — Z96649 Presence of unspecified artificial hip joint: Secondary | ICD-10-CM

## 2021-10-08 DIAGNOSIS — Z7984 Long term (current) use of oral hypoglycemic drugs: Secondary | ICD-10-CM | POA: Insufficient documentation

## 2021-10-08 DIAGNOSIS — Z794 Long term (current) use of insulin: Secondary | ICD-10-CM | POA: Diagnosis not present

## 2021-10-08 DIAGNOSIS — E119 Type 2 diabetes mellitus without complications: Secondary | ICD-10-CM | POA: Diagnosis not present

## 2021-10-08 DIAGNOSIS — Z419 Encounter for procedure for purposes other than remedying health state, unspecified: Secondary | ICD-10-CM

## 2021-10-08 DIAGNOSIS — M1611 Unilateral primary osteoarthritis, right hip: Principal | ICD-10-CM | POA: Insufficient documentation

## 2021-10-08 DIAGNOSIS — J45909 Unspecified asthma, uncomplicated: Secondary | ICD-10-CM | POA: Diagnosis not present

## 2021-10-08 DIAGNOSIS — Z96641 Presence of right artificial hip joint: Secondary | ICD-10-CM

## 2021-10-08 HISTORY — PX: TOTAL HIP ARTHROPLASTY: SHX124

## 2021-10-08 LAB — GLUCOSE, CAPILLARY
Glucose-Capillary: 117 mg/dL — ABNORMAL HIGH (ref 70–99)
Glucose-Capillary: 99 mg/dL (ref 70–99)
Glucose-Capillary: 101 mg/dL — ABNORMAL HIGH (ref 70–99)
Glucose-Capillary: 191 mg/dL — ABNORMAL HIGH (ref 70–99)

## 2021-10-08 LAB — ABO/RH: ABO/RH(D): O NEG

## 2021-10-08 SURGERY — ARTHROPLASTY, HIP, TOTAL, ANTERIOR APPROACH
Anesthesia: Spinal | Site: Hip | Laterality: Right

## 2021-10-08 MED ORDER — ONDANSETRON HCL 4 MG/2ML IJ SOLN
4.0000 mg | Freq: Four times a day (QID) | INTRAMUSCULAR | Status: DC | PRN
  Administered 2021-10-08: 4 mg via INTRAVENOUS
  Filled 2021-10-08: qty 2

## 2021-10-08 MED ORDER — CHLORHEXIDINE GLUCONATE 0.12 % MT SOLN
15.0000 mL | Freq: Once | OROMUCOSAL | Status: AC
  Administered 2021-10-08: 15 mL via OROMUCOSAL
  Filled 2021-10-08: qty 15

## 2021-10-08 MED ORDER — OXYCODONE HCL 5 MG PO TABS: 10.0000 mg | ORAL_TABLET | ORAL | Status: DC | PRN

## 2021-10-08 MED ORDER — VANCOMYCIN HCL 1000 MG IV SOLR
INTRAVENOUS | Status: AC
  Filled 2021-10-08: qty 20

## 2021-10-08 MED ORDER — PROPOFOL 500 MG/50ML IV EMUL
INTRAVENOUS | Status: DC | PRN
  Administered 2021-10-08: 100 ug/kg/min via INTRAVENOUS

## 2021-10-08 MED ORDER — CEFAZOLIN SODIUM-DEXTROSE 2-4 GM/100ML-% IV SOLN
2.0000 g | Freq: Four times a day (QID) | INTRAVENOUS | Status: AC
  Administered 2021-10-08 (×2): 2 g via INTRAVENOUS
  Filled 2021-10-08 (×2): qty 100

## 2021-10-08 MED ORDER — DEXAMETHASONE SODIUM PHOSPHATE 10 MG/ML IJ SOLN
10.0000 mg | Freq: Once | INTRAMUSCULAR | Status: AC
  Administered 2021-10-09: 10 mg via INTRAVENOUS
  Filled 2021-10-08: qty 1

## 2021-10-08 MED ORDER — MIDAZOLAM HCL 2 MG/2ML IJ SOLN
INTRAMUSCULAR | Status: DC | PRN
  Administered 2021-10-08: 2 mg via INTRAVENOUS

## 2021-10-08 MED ORDER — BUPIVACAINE-MELOXICAM ER 400-12 MG/14ML IJ SOLN
INTRAMUSCULAR | Status: AC
  Filled 2021-10-08: qty 1

## 2021-10-08 MED ORDER — LACTATED RINGERS IV SOLN: INTRAVENOUS | Status: DC

## 2021-10-08 MED ORDER — TRANEXAMIC ACID-NACL 1000-0.7 MG/100ML-% IV SOLN
INTRAVENOUS | Status: AC
  Filled 2021-10-08: qty 100

## 2021-10-08 MED ORDER — ALUM & MAG HYDROXIDE-SIMETH 200-200-20 MG/5ML PO SUSP: 30.0000 mL | ORAL | Status: DC | PRN

## 2021-10-08 MED ORDER — OXYCODONE HCL 5 MG PO TABS
5.0000 mg | ORAL_TABLET | Freq: Once | ORAL | Status: AC | PRN
  Administered 2021-10-08: 5 mg via ORAL

## 2021-10-08 MED ORDER — MUPIROCIN 2 % EX OINT
TOPICAL_OINTMENT | CUTANEOUS | Status: AC
  Filled 2021-10-08: qty 22

## 2021-10-08 MED ORDER — DIPHENHYDRAMINE HCL 12.5 MG/5ML PO ELIX
25.0000 mg | ORAL_SOLUTION | ORAL | Status: DC | PRN
  Filled 2021-10-08: qty 10

## 2021-10-08 MED ORDER — SODIUM CHLORIDE 0.9 % IV SOLN: INTRAVENOUS | Status: DC

## 2021-10-08 MED ORDER — SODIUM CHLORIDE 0.9 % IR SOLN
Status: DC | PRN
  Administered 2021-10-08: 1000 mL

## 2021-10-08 MED ORDER — METOCLOPRAMIDE HCL 5 MG PO TABS: 5.0000 mg | ORAL_TABLET | Freq: Three times a day (TID) | ORAL | Status: DC | PRN

## 2021-10-08 MED ORDER — PHENOL 1.4 % MT LIQD: 1.0000 | OROMUCOSAL | Status: DC | PRN

## 2021-10-08 MED ORDER — POLYETHYLENE GLYCOL 3350 17 G PO PACK
17.0000 g | PACK | Freq: Every day | ORAL | Status: DC
  Administered 2021-10-08 – 2021-10-09 (×2): 17 g via ORAL
  Filled 2021-10-08 (×2): qty 1

## 2021-10-08 MED ORDER — PANTOPRAZOLE SODIUM 40 MG PO TBEC
40.0000 mg | DELAYED_RELEASE_TABLET | Freq: Every day | ORAL | Status: DC
  Administered 2021-10-08 – 2021-10-09 (×2): 40 mg via ORAL
  Filled 2021-10-08 (×2): qty 1

## 2021-10-08 MED ORDER — BUPIVACAINE IN DEXTROSE 0.75-8.25 % IT SOLN
INTRATHECAL | Status: DC | PRN
  Administered 2021-10-08: 1.8 mL via INTRATHECAL

## 2021-10-08 MED ORDER — METHOCARBAMOL 500 MG PO TABS
500.0000 mg | ORAL_TABLET | Freq: Four times a day (QID) | ORAL | Status: DC | PRN
  Administered 2021-10-08 – 2021-10-09 (×4): 500 mg via ORAL
  Filled 2021-10-08 (×4): qty 1

## 2021-10-08 MED ORDER — ASPIRIN 81 MG PO CHEW
81.0000 mg | CHEWABLE_TABLET | Freq: Two times a day (BID) | ORAL | Status: DC
  Administered 2021-10-08 – 2021-10-09 (×2): 81 mg via ORAL
  Filled 2021-10-08 (×2): qty 1

## 2021-10-08 MED ORDER — SORBITOL 70 % SOLN: 30.0000 mL | Freq: Every day | Status: DC | PRN

## 2021-10-08 MED ORDER — TRANEXAMIC ACID 1000 MG/10ML IV SOLN
INTRAVENOUS | Status: DC | PRN
  Administered 2021-10-08: 2000 mg via TOPICAL

## 2021-10-08 MED ORDER — PROPOFOL 10 MG/ML IV BOLUS
INTRAVENOUS | Status: DC | PRN
  Administered 2021-10-08: 20 mg via INTRAVENOUS

## 2021-10-08 MED ORDER — CEFAZOLIN SODIUM-DEXTROSE 2-4 GM/100ML-% IV SOLN
2.0000 g | INTRAVENOUS | Status: AC
  Administered 2021-10-08: 2 g via INTRAVENOUS
  Filled 2021-10-08: qty 100

## 2021-10-08 MED ORDER — ACETAMINOPHEN 500 MG PO TABS
1000.0000 mg | ORAL_TABLET | Freq: Four times a day (QID) | ORAL | Status: DC
  Administered 2021-10-08 – 2021-10-09 (×3): 1000 mg via ORAL
  Filled 2021-10-08 (×3): qty 2

## 2021-10-08 MED ORDER — ACETAMINOPHEN 500 MG PO TABS
1000.0000 mg | ORAL_TABLET | Freq: Once | ORAL | Status: AC
  Administered 2021-10-08: 1000 mg via ORAL
  Filled 2021-10-08: qty 2

## 2021-10-08 MED ORDER — 0.9 % SODIUM CHLORIDE (POUR BTL) OPTIME
TOPICAL | Status: DC | PRN
  Administered 2021-10-08: 1000 mL

## 2021-10-08 MED ORDER — METHOCARBAMOL 1000 MG/10ML IJ SOLN: 500.0000 mg | Freq: Four times a day (QID) | INTRAVENOUS | Status: DC | PRN

## 2021-10-08 MED ORDER — HYDROMORPHONE HCL 1 MG/ML IJ SOLN: 0.2500 mg | INTRAMUSCULAR | Status: DC | PRN

## 2021-10-08 MED ORDER — ONDANSETRON HCL 4 MG/2ML IJ SOLN
INTRAMUSCULAR | Status: DC | PRN
Start: 2021-10-08 — End: 2021-10-08
  Administered 2021-10-08: 4 mg via INTRAVENOUS

## 2021-10-08 MED ORDER — DOCUSATE SODIUM 100 MG PO CAPS
100.0000 mg | ORAL_CAPSULE | Freq: Two times a day (BID) | ORAL | Status: DC
  Administered 2021-10-08 – 2021-10-09 (×2): 100 mg via ORAL
  Filled 2021-10-08 (×2): qty 1

## 2021-10-08 MED ORDER — ONDANSETRON HCL 4 MG PO TABS: 4.0000 mg | ORAL_TABLET | Freq: Four times a day (QID) | ORAL | Status: DC | PRN

## 2021-10-08 MED ORDER — TRANEXAMIC ACID-NACL 1000-0.7 MG/100ML-% IV SOLN
1000.0000 mg | Freq: Once | INTRAVENOUS | Status: AC
  Administered 2021-10-08: 1000 mg via INTRAVENOUS
  Filled 2021-10-08: qty 100

## 2021-10-08 MED ORDER — MENTHOL 3 MG MT LOZG: 1.0000 | LOZENGE | OROMUCOSAL | Status: DC | PRN

## 2021-10-08 MED ORDER — BUPIVACAINE-MELOXICAM ER 400-12 MG/14ML IJ SOLN
INTRAMUSCULAR | Status: DC | PRN
  Administered 2021-10-08: 400 mg

## 2021-10-08 MED ORDER — PRONTOSAN WOUND IRRIGATION OPTIME
TOPICAL | Status: DC | PRN
  Administered 2021-10-08: 1 via TOPICAL

## 2021-10-08 MED ORDER — METOCLOPRAMIDE HCL 5 MG/ML IJ SOLN: 5.0000 mg | Freq: Three times a day (TID) | INTRAMUSCULAR | Status: DC | PRN

## 2021-10-08 MED ORDER — OXYCODONE HCL 5 MG/5ML PO SOLN: 5.0000 mg | Freq: Once | ORAL | Status: AC | PRN

## 2021-10-08 MED ORDER — FLUOXETINE HCL 20 MG PO CAPS
20.0000 mg | ORAL_CAPSULE | Freq: Every day | ORAL | Status: DC
  Administered 2021-10-08: 20 mg via ORAL
  Filled 2021-10-08: qty 1

## 2021-10-08 MED ORDER — FENTANYL CITRATE (PF) 250 MCG/5ML IJ SOLN
INTRAMUSCULAR | Status: AC
  Filled 2021-10-08: qty 5

## 2021-10-08 MED ORDER — OXYCODONE HCL ER 10 MG PO T12A
10.0000 mg | EXTENDED_RELEASE_TABLET | Freq: Two times a day (BID) | ORAL | Status: DC
  Administered 2021-10-08 – 2021-10-09 (×2): 10 mg via ORAL
  Filled 2021-10-08 (×2): qty 1

## 2021-10-08 MED ORDER — HYDROMORPHONE HCL 1 MG/ML IJ SOLN
0.5000 mg | INTRAMUSCULAR | Status: DC | PRN
  Administered 2021-10-08: 0.5 mg via INTRAVENOUS
  Filled 2021-10-08: qty 1

## 2021-10-08 MED ORDER — ACETAMINOPHEN 325 MG PO TABS: 325.0000 mg | ORAL_TABLET | Freq: Four times a day (QID) | ORAL | Status: DC | PRN

## 2021-10-08 MED ORDER — EPHEDRINE SULFATE-NACL 50-0.9 MG/10ML-% IV SOSY
PREFILLED_SYRINGE | INTRAVENOUS | Status: DC | PRN
  Administered 2021-10-08 (×2): 5 mg via INTRAVENOUS

## 2021-10-08 MED ORDER — OXYCODONE HCL 5 MG PO TABS
ORAL_TABLET | ORAL | Status: AC
  Filled 2021-10-08: qty 1

## 2021-10-08 MED ORDER — PHENYLEPHRINE HCL-NACL 20-0.9 MG/250ML-% IV SOLN
INTRAVENOUS | Status: DC | PRN
Start: 2021-10-08 — End: 2021-10-08
  Administered 2021-10-08: 30 ug/min via INTRAVENOUS

## 2021-10-08 MED ORDER — MIDAZOLAM HCL 2 MG/2ML IJ SOLN
INTRAMUSCULAR | Status: AC
  Filled 2021-10-08: qty 2

## 2021-10-08 MED ORDER — CEFAZOLIN SODIUM-DEXTROSE 2-4 GM/100ML-% IV SOLN: 2.0000 g | Freq: Four times a day (QID) | INTRAVENOUS | Status: DC

## 2021-10-08 MED ORDER — VANCOMYCIN HCL 1 G IV SOLR
INTRAVENOUS | Status: DC | PRN
  Administered 2021-10-08: 1000 mg

## 2021-10-08 MED ORDER — OXYCODONE HCL 5 MG PO TABS
5.0000 mg | ORAL_TABLET | ORAL | Status: DC | PRN
  Administered 2021-10-09 (×2): 10 mg via ORAL
  Filled 2021-10-08 (×3): qty 2

## 2021-10-08 MED ORDER — INSULIN ASPART 100 UNIT/ML IJ SOLN: 0.0000 [IU] | INTRAMUSCULAR | Status: DC | PRN

## 2021-10-08 MED ORDER — DEXAMETHASONE SODIUM PHOSPHATE 10 MG/ML IJ SOLN
INTRAMUSCULAR | Status: DC | PRN
  Administered 2021-10-08: 10 mg via INTRAVENOUS

## 2021-10-08 MED ORDER — PHENYLEPHRINE 40 MCG/ML (10ML) SYRINGE FOR IV PUSH (FOR BLOOD PRESSURE SUPPORT)
PREFILLED_SYRINGE | INTRAVENOUS | Status: DC | PRN
Start: 2021-10-08 — End: 2021-10-08
  Administered 2021-10-08: 160 ug via INTRAVENOUS
  Administered 2021-10-08: 80 ug via INTRAVENOUS
  Administered 2021-10-08: 120 ug via INTRAVENOUS

## 2021-10-08 MED ORDER — TRANEXAMIC ACID-NACL 1000-0.7 MG/100ML-% IV SOLN
1000.0000 mg | INTRAVENOUS | Status: AC
  Administered 2021-10-08: 1000 mg via INTRAVENOUS
  Filled 2021-10-08: qty 100

## 2021-10-08 SURGICAL SUPPLY — 64 items
BAG COUNTER SPONGE SURGICOUNT (BAG) ×2 IMPLANT
BAG DECANTER FOR FLEXI CONT (MISCELLANEOUS) ×2 IMPLANT
BAG SPNG CNTER NS LX DISP (BAG) ×1
CELLS DAT CNTRL 66122 CELL SVR (MISCELLANEOUS) IMPLANT
COOLER ICEMAN CLASSIC (MISCELLANEOUS) ×1 IMPLANT
COVER PERINEAL POST (MISCELLANEOUS) ×2 IMPLANT
COVER SURGICAL LIGHT HANDLE (MISCELLANEOUS) ×2 IMPLANT
DRAPE C-ARM 42X72 X-RAY (DRAPES) ×2 IMPLANT
DRAPE POUCH INSTRU U-SHP 10X18 (DRAPES) ×2 IMPLANT
DRAPE STERI IOBAN 125X83 (DRAPES) ×2 IMPLANT
DRAPE U-SHAPE 47X51 STRL (DRAPES) ×4 IMPLANT
DRESSING PEEL AND PLC PRVNA 13 (GAUZE/BANDAGES/DRESSINGS) IMPLANT
DRSG AQUACEL AG ADV 3.5X10 (GAUZE/BANDAGES/DRESSINGS) ×2 IMPLANT
DRSG PEEL AND PLACE PREVENA 13 (GAUZE/BANDAGES/DRESSINGS) ×2
DURAPREP 26ML APPLICATOR (WOUND CARE) ×4 IMPLANT
ELECT BLADE 4.0 EZ CLEAN MEGAD (MISCELLANEOUS) ×2
ELECT REM PT RETURN 9FT ADLT (ELECTROSURGICAL) ×2
ELECTRODE BLDE 4.0 EZ CLN MEGD (MISCELLANEOUS) ×1 IMPLANT
ELECTRODE REM PT RTRN 9FT ADLT (ELECTROSURGICAL) ×1 IMPLANT
GLOVE BIOGEL PI IND STRL 7.5 (GLOVE) ×4 IMPLANT
GLOVE BIOGEL PI INDICATOR 7.5 (GLOVE) ×4
GLOVE SURG LTX SZ7 (GLOVE) ×4 IMPLANT
GLOVE SURG UNDER POLY LF SZ7 (GLOVE) ×4 IMPLANT
GLOVE SURG UNDER POLY LF SZ7.5 (GLOVE) ×4 IMPLANT
GOWN STRL REIN XL XLG (GOWN DISPOSABLE) ×2 IMPLANT
GOWN STRL REUS W/ TWL LRG LVL3 (GOWN DISPOSABLE) IMPLANT
GOWN STRL REUS W/ TWL XL LVL3 (GOWN DISPOSABLE) ×1 IMPLANT
GOWN STRL REUS W/TWL LRG LVL3 (GOWN DISPOSABLE)
GOWN STRL REUS W/TWL XL LVL3 (GOWN DISPOSABLE) ×2
HANDPIECE INTERPULSE COAX TIP (DISPOSABLE) ×2
HEAD CERAMIC DELTA 36 PLUS 1.5 (Hips) ×1 IMPLANT
HOOD PEEL AWAY FLYTE STAYCOOL (MISCELLANEOUS) ×4 IMPLANT
IV NS IRRIG 3000ML ARTHROMATIC (IV SOLUTION) ×2 IMPLANT
JET LAVAGE IRRISEPT WOUND (IRRIGATION / IRRIGATOR) ×2
KIT BASIN OR (CUSTOM PROCEDURE TRAY) ×2 IMPLANT
LAVAGE JET IRRISEPT WOUND (IRRIGATION / IRRIGATOR) ×1 IMPLANT
LINER NEUTRAL 52X36MM PLUS 4 (Liner) ×1 IMPLANT
MARKER SKIN DUAL TIP RULER LAB (MISCELLANEOUS) ×2 IMPLANT
NDL SPNL 18GX3.5 QUINCKE PK (NEEDLE) ×1 IMPLANT
NEEDLE SPNL 18GX3.5 QUINCKE PK (NEEDLE) ×2 IMPLANT
PACK TOTAL JOINT (CUSTOM PROCEDURE TRAY) ×2 IMPLANT
PACK UNIVERSAL I (CUSTOM PROCEDURE TRAY) ×2 IMPLANT
PAD COLD SHLDR WRAP-ON (PAD) ×1 IMPLANT
PIN SECTOR W/GRIP ACE CUP 52MM (Hips) ×1 IMPLANT
RETRACTOR WND ALEXIS 18 MED (MISCELLANEOUS) IMPLANT
RTRCTR WOUND ALEXIS 18CM MED (MISCELLANEOUS)
SAW OSC TIP CART 19.5X105X1.3 (SAW) ×2 IMPLANT
SCREW 6.5MMX25MM (Screw) ×1 IMPLANT
SET HNDPC FAN SPRY TIP SCT (DISPOSABLE) ×1 IMPLANT
STAPLER VISISTAT 35W (STAPLE) IMPLANT
STEM FEM ACTIS STD SZ4 (Stem) ×1 IMPLANT
SUT ETHIBOND 2 V 37 (SUTURE) ×2 IMPLANT
SUT VIC AB 0 CT1 27 (SUTURE) ×2
SUT VIC AB 0 CT1 27XBRD ANBCTR (SUTURE) ×1 IMPLANT
SUT VIC AB 1 CTX 36 (SUTURE) ×2
SUT VIC AB 1 CTX36XBRD ANBCTR (SUTURE) ×1 IMPLANT
SUT VIC AB 2-0 CT1 27 (SUTURE) ×4
SUT VIC AB 2-0 CT1 TAPERPNT 27 (SUTURE) ×2 IMPLANT
SYR 50ML LL SCALE MARK (SYRINGE) ×2 IMPLANT
TOWEL GREEN STERILE (TOWEL DISPOSABLE) ×2 IMPLANT
TRAY CATH 16FR W/PLASTIC CATH (SET/KITS/TRAYS/PACK) IMPLANT
TRAY FOLEY W/BAG SLVR 16FR (SET/KITS/TRAYS/PACK) ×2
TRAY FOLEY W/BAG SLVR 16FR ST (SET/KITS/TRAYS/PACK) ×1 IMPLANT
YANKAUER SUCT BULB TIP NO VENT (SUCTIONS) ×2 IMPLANT

## 2021-10-08 NOTE — Discharge Instructions (Signed)

## 2021-10-08 NOTE — Op Note (Signed)
? ?RIGHT TOTAL HIP ARTHROPLASTY ANTERIOR APPROACH, INCISIONAL VAC  Procedure Note ?ELLIEMAE KOLLIE   LJ:[id number] ? ?Pre-op Diagnosis: right hip degenerative joint disease ?    ?Post-op Diagnosis: same ?  ?Operative Procedures  ?1. Total hip replacement; Right hip; uncemented [id number]  ?2. Application of incisional VAC right hip.  CPT [phone] ? ?Surgeon: Frankey Shown, M.D. ? ?Assist: Madalyn Rob, PA-C ?  ?Anesthesia: spinal, local ? ?Prosthesis: Depuy ?Acetabulum: Pinnacle 52 mm ?Femur: Actis 4 STD ?Head: 36 mm size: +1.5 ?Liner: +4 ?Bearing Type: ceramic/poly ? ?Total Hip Arthroplasty (Anterior Approach) Op Note:  ?After informed consent was obtained and the operative extremity marked in the holding area, the patient was brought back to the operating room and placed supine on the HANA table. Next, the operative extremity was prepped and draped in normal sterile fashion. Surgical timeout occurred verifying patient identification, surgical site, surgical procedure and administration of antibiotics.  ?A modified anterior Smith-Peterson approach to the hip was performed, using the interval between tensor fascia lata and sartorius.  Dissection was carried bluntly down onto the anterior hip capsule. The lateral femoral circumflex vessels were identified and coagulated. A capsulotomy was performed and the capsular flaps tagged for later repair.  The neck osteotomy was performed. The femoral head was removed which showed severe wear, the acetabular rim was cleared of soft tissue and attention was turned to reaming the acetabulum.  ?Sequential reaming was performed under fluoroscopic guidance. We reamed to a size 51 mm, and then impacted the acetabular shell. A 25 mm cancellous screw was placed through the shell for added fixation.  The liner was then placed after irrigation and attention turned to the femur.  ?After placing the femoral hook, the leg was taken to externally rotated, extended and adducted position taking  care to perform soft tissue releases to allow for adequate mobilization of the femur. Soft tissue was cleared from the shoulder of the greater trochanter and the hook elevator used to improve exposure of the proximal femur. Sequential broaching performed up to a size 4. Trial neck and head were placed. The leg was brought back up to neutral and the construct reduced.  Antibiotic irrigation was placed in the surgical wound and kept for at least 1 minute.  The position and sizing of components, offset and leg lengths were checked using fluoroscopy. Stability of the construct was checked in extension and external rotation without any subluxation or impingement of prosthesis. We dislocated the prosthesis, dropped the leg back into position, removed trial components, and irrigated copiously. The final stem and head was then placed, the leg brought back up, the system reduced and fluoroscopy used to verify positioning.  ?We irrigated, obtained hemostasis and closed the capsule using #2 ethibond suture.  One gram of vancomycin powder was placed in the surgical bed.   One gram of topical tranexamic acid was injected into the joint.  The fascia was closed with #1 vicryl plus, the deep fat layer was closed with 0 vicryl, the subcutaneous layers closed with 2.0 Vicryl Plus and the skin closed with 2.0 nylon and incisional VAC due to large overhanging abdominal pannus. A sterile dressing was applied. The patient was awakened in the operating room and taken to recovery in stable condition.  ?All sponge, needle, and instrument counts were correct at the end of the case.  ? ?Tawanna Cooler, my PA, was a medical necessity for opening, closing, limb positioning, retracting, exposing, and overall facilitation and timely completion of the surgery. ? ?  Position: supine  ?Complications: see description of procedure.  ?Time Out: performed  ? ?Drains/Packing: none ? ?Estimated blood loss: see anesthesia record ? ?Returned to Recovery  Room: in good condition.  ? ?Antibiotics: yes  ? ?Mechanical VTE (DVT) Prophylaxis: sequential compression devices, TED thigh-high  ?Chemical VTE (DVT) Prophylaxis: aspirin  ? ?Fluid Replacement: see anesthesia record ? ?Specimens Removed: 1 to pathology  ? ?Sponge and Instrument Count Correct? yes  ? ?PACU: portable radiograph - low AP  ? ?Plan/RTC: Return in 2 weeks for staple removal. ?Weight Bearing/Load Lower Extremity: full  ?Hip precautions: none ?Suture Removal: 2 weeks  ? ?N. Eduard Roux, MD ?[facility] ?1:12 PM ? ? ?Implant Name Type Inv. Item Serial No. Manufacturer Lot No. LRB No. Used Action  ?SCREW 6.5MMX25MM - [id number] Screw SCREW 6.5MMX25MM  [employer]  Right 1 Implanted  ?PIN SECTOR W/GRIP ACE CUP 52MM - [id number] Hips PIN SECTOR W/GRIP ACE CUP 52MM  [employer] [id number] Right 1 Implanted  ?LINER NEUTRAL 52X36MM PLUS 4 - [id number] Liner LINER NEUTRAL 52X36MM PLUS 4  [employer] [id number] Right 1 Implanted  ?STEM FEM ACTIS STD SZ4 [id number] Stem STEM FEM ACTIS STD SZ4  [employer] [id number] Right 1 Implanted  ?HEAD CERAMIC DELTA 36 PLUS 1.5 - [id number] Hips HEAD CERAMIC DELTA 36 PLUS 1.5  [employer] [id number] Right 1 Implanted  ? ? ?

## 2021-10-08 NOTE — TOC Progression Note (Signed)
Transition of Care (TOC) - Progression Note  ? ? ?Patient Details  ?Name: Deborah Bolton ?MRN: [id number] ?Date of Birth: [birth year] ? ?Transition of Care (TOC) CM/SW Contact  ?Kingsley Plan, RN ?Phone Number: ?10/08/2021, 4:35 PM ? ?Clinical Narrative:    ? ?Dr Roda Shutters office has arranged home health services with [facility].  ? ?3 C staff with provide any needed DME.  ? ? ?Transition of Care (TOC) Screening Note ? ? ?Patient Details  ?Name: Deborah Bolton ?Date of Birth: 10/19/1960 ? ? ? ? ?Transition of Care Department [facility]) has reviewed patient and no TOC needs have been identified at this time. We will continue to monitor patient advancement through interdisciplinary progression rounds. If new patient transition needs arise, please place a TOC consult. ?  ? ?  ?  ? ?Expected Discharge Plan and Services ?  ?  ?  ?  ?  ?                ?  ?  ?  ?  ?  ?  ?  ?  ?  ?  ? ? ?Social Determinants of Health (SDOH) Interventions ?  ? ?Readmission Risk Interventions ?No flowsheet data found. ? ?

## 2021-10-08 NOTE — H&P (Signed)
? ? ?PREOPERATIVE H&P ? ?Chief Complaint: right hip degenerative joint disease ? ?HPI: ?Deborah Bolton is a 61 y.o. female who presents for surgical treatment of right hip degenerative joint disease.  She denies any changes in medical history. ? ?Past Medical History:  ?Diagnosis Date  ? Arthritis   ? Asthma   ? Complication of anesthesia   ? says she is "sensitive" to sedating medications  ? Depression   ? Diabetes mellitus without complication (HCC)   ? Dyspnea   ? History of kidney stones   ? ?Past Surgical History:  ?Procedure Laterality Date  ? JOINT REPLACEMENT    ? TUBAL LIGATION    ? ?Social History  ? ?Socioeconomic History  ? Marital status: Married  ?  Spouse name: Not on file  ? Number of children: Not on file  ? Years of education: Not on file  ? Highest education level: Not on file  ?Occupational History  ? Not on file  ?Tobacco Use  ? Smoking status: Never  ? Smokeless tobacco: Never  ?Vaping Use  ? Vaping Use: Not on file  ?Substance and Sexual Activity  ? Alcohol use: Not Currently  ? Drug use: Not Currently  ? Sexual activity: Not on file  ?Other Topics Concern  ? Not on file  ?Social History Narrative  ? Not on file  ? ?Social Determinants of Health  ? ?Financial Resource Strain: Not on file  ?Food Insecurity: Not on file  ?Transportation Needs: Not on file  ?Physical Activity: Not on file  ?Stress: Not on file  ?Social Connections: Not on file  ? ?History reviewed. No pertinent family history. ?Allergies  ?Allergen Reactions  ? Iodine   ?  Betadine blisters, chemical burns   ? Other   ?  General anesthesia - takes a long time to wake up   ? ?Prior to Admission medications   ?Medication Sig Start Date End Date Taking? Authorizing Provider  ?albuterol (PROVENTIL) (2.5 MG/3ML) 0.083% nebulizer solution Take 2.5 mg by nebulization every 6 (six) hours as needed for wheezing or shortness of breath.   Yes [provider]  ?albuterol (VENTOLIN HFA) 108 (90 Base) MCG/ACT inhaler Inhale 2 puffs  into the lungs every 6 (six) hours as needed for wheezing or shortness of breath.   Yes [provider]  ?Ascorbic Acid (VITAMIN C) 1000 MG tablet Take 1,000 mg by mouth daily.   Yes [provider]  ?aspirin EC 81 MG tablet Take 1 tablet (81 mg total) by mouth 2 (two) times daily. To be taken after surgery to prevent blood clots 10/01/21   Cristie Hem, PA-C  ?Biotin 5 MG CAPS Take 5 mg by mouth daily.   Yes [provider]  ?celecoxib (CELEBREX) 200 MG capsule Take 200 mg by mouth 2 (two) times daily. 09/08/21  Yes [provider]  ?Coenzyme Q10 (COQ-10) 100 MG CAPS Take 100 mg by mouth at bedtime.   Yes [provider]  ?docusate sodium (COLACE) 100 MG capsule Take 1 capsule (100 mg total) by mouth daily as needed. 10/01/21 10/01/22  Cristie Hem, PA-C  ?Dulaglutide (TRULICITY) 0.75 MG/0.5ML SOPN Inject 7.5 mg into the skin every Wednesday.   Yes [provider]  ?ferrous sulfate 325 (65 FE) MG tablet Take 325 mg by mouth at bedtime.   Yes [provider]  ?FLUoxetine (PROZAC) 20 MG capsule Take 20 mg by mouth at bedtime.   Yes [provider]  ?fluticasone Aleda Grana)  50 MCG/ACT nasal spray Place 1 spray into both nostrils daily as needed for allergies or rhinitis.   Yes [provider]  ?Garlic 1000 MG CAPS Take 1,000 mg by mouth daily.   Yes [provider]  ?ibuprofen (ADVIL) 200 MG tablet Take 400 mg by mouth every 6 (six) hours as needed for moderate pain.   Yes [provider]  ?Magnesium 500 MG TABS Take 500 mg by mouth daily.   Yes [provider]  ?metFORMIN (GLUCOPHAGE) 1000 MG tablet Take 1,000 mg by mouth daily with breakfast.   Yes [provider]  ?methocarbamol (ROBAXIN) 500 MG tablet Take 1 tablet (500 mg total) by mouth 2 (two) times daily as needed. 10/01/21   Cristie Hem, PA-C  ?Multiple Vitamin (MULTIVITAMIN WITH MINERALS) TABS tablet Take 1 tablet by mouth daily.   Yes  [provider]  ?ondansetron (ZOFRAN) 4 MG tablet Take 1 tablet (4 mg total) by mouth every 8 (eight) hours as needed for nausea or vomiting. 10/01/21   Cristie Hem, PA-C  ?oxybutynin (DITROPAN XL) 15 MG 24 hr tablet Take 15 mg by mouth 2 (two) times daily.   Yes [provider]  ?oxyCODONE-acetaminophen (PERCOCET) 5-325 MG tablet Take 1-2 tablets by mouth every 6 (six) hours as needed. To be taken after surgery 10/01/21   Cristie Hem, PA-C  ?Quercetin 500 MG CAPS Take 500 mg by mouth daily.   Yes [provider]  ?sulfamethoxazole-trimethoprim (BACTRIM DS) 800-160 MG tablet Take 1 tablet by mouth 2 (two) times daily. To be taken after surgery 10/01/21   Cristie Hem, PA-C  ?zinc gluconate 50 MG tablet Take 50 mg by mouth daily.   Yes [provider]  ? ? ? ?Positive ROS: All other systems have been reviewed and were otherwise negative with the exception of those mentioned in the HPI and as above. ? ?Physical Exam: ?General: Alert, no acute distress ?Cardiovascular: No pedal edema ?Respiratory: No cyanosis, no use of accessory musculature ?GI: abdomen soft ?Skin: No lesions in the area of chief complaint ?Neurologic: Sensation intact distally ?Psychiatric: Patient is competent for consent with normal mood and affect ?Lymphatic: no lymphedema ? ?MUSCULOSKELETAL: exam stable ? ?Assessment: ?right hip degenerative joint disease ? ?Plan: ?Plan for Procedure(s): ?RIGHT TOTAL HIP ARTHROPLASTY ANTERIOR APPROACH, INCISIONAL VAC ? ?The risks benefits and alternatives were discussed with the patient including but not limited to the risks of nonoperative treatment, versus surgical intervention including infection, bleeding, nerve injury,  blood clots, cardiopulmonary complications, morbidity, mortality, among others, and they were willing to proceed.  ? ?Preoperative templating of the joint replacement has been completed, documented, and submitted to the Operating Room personnel  in order to optimize intra-operative equipment management. ? ? ?Glee Arvin, MD ?10/08/2021 ?9:20 AM ? ?

## 2021-10-08 NOTE — Transfer of Care (Signed)
Immediate Anesthesia Transfer of Care Note ? ?Patient: Deborah Bolton ? ?Procedure(s) Performed: RIGHT TOTAL HIP ARTHROPLASTY ANTERIOR APPROACH, INCISIONAL VAC (Right: Hip) ? ?Patient Location: PACU ? ?Anesthesia Type:Spinal ? ?Level of Consciousness: awake, alert  and patient cooperative ? ?Airway & Oxygen Therapy: Patient Spontanous Breathing and Patient connected to face mask oxygen ? ?Post-op Assessment: Report given to RN and Post -op Vital signs reviewed and stable ? ?Post vital signs: Reviewed and stable ? ?Last Vitals:  ?Vitals Value Taken Time  ?BP 97/65 10/08/21 1344  ?Temp    ?Pulse 74 10/08/21 1346  ?Resp 14 10/08/21 1346  ?SpO2 96 % 10/08/21 1346  ?Vitals shown include unvalidated device data. ? ?Last Pain:  ?Vitals:  ? 10/08/21 0923  ?TempSrc:   ?PainSc: 0-No pain  ?   ? ?  ? ?Complications: No notable events documented. ?

## 2021-10-08 NOTE — Anesthesia Postprocedure Evaluation (Signed)
Anesthesia Post Note ? ?Patient: Deborah Bolton ? ?Procedure(s) Performed: RIGHT TOTAL HIP ARTHROPLASTY ANTERIOR APPROACH, INCISIONAL VAC (Right: Hip) ? ?  ? ?Patient location during evaluation: PACU ?Anesthesia Type: Spinal ?Level of consciousness: oriented and awake and alert ?Pain management: pain level controlled ?Vital Signs Assessment: post-procedure vital signs reviewed and stable ?Respiratory status: spontaneous breathing and respiratory function stable ?Cardiovascular status: blood pressure returned to baseline and stable ?Postop Assessment: no headache, no backache, no apparent nausea or vomiting, spinal receding and patient able to bend at knees ?Anesthetic complications: no ? ? ?No notable events documented. ? ?Last Vitals:  ?Vitals:  ? 10/08/21 1530 10/08/21 1545  ?BP: 140/86 135/80  ?Pulse: 68 72  ?Resp: 13 18  ?Temp:    ?SpO2: 95% 95%  ?  ?Last Pain:  ?Vitals:  ? 10/08/21 1515  ?TempSrc:   ?PainSc: 0-No pain  ? ? ?  ?  ?  ?  ?  ?  ? ?Evangelynn Lochridge,W. EDMOND ? ? ? ? ?

## 2021-10-08 NOTE — Anesthesia Procedure Notes (Signed)
Spinal ? ?Patient location during procedure: OR ?Start time: 10/08/2021 12:00 PM ?End time: 10/08/2021 12:04 PM ?Reason for block: surgical anesthesia ?Staffing ?Performed: anesthesiologist  ?Anesthesiologist: Gaynelle Adu, MD ?Preanesthetic Checklist ?Completed: patient identified, IV checked, risks and benefits discussed, surgical consent, monitors and equipment checked, pre-op evaluation and timeout performed ?Spinal Block ?Patient position: sitting ?Prep: DuraPrep ?Patient monitoring: cardiac monitor, continuous pulse ox and blood pressure ?Approach: midline ?Location: L3-4 ?Injection technique: single-shot ?Needle ?Needle type: Pencan  ?Needle gauge: 24 G ?Needle length: 9 cm ?Assessment ?Sensory level: T8 ?Events: CSF return ?Additional Notes ?Functioning IV was confirmed and monitors were applied. Sterile prep and drape, including hand hygiene and sterile gloves were used. The patient was positioned and the spine was prepped. The skin was anesthetized with lidocaine.  Free flow of clear CSF was obtained prior to injecting local anesthetic into the CSF.  The spinal needle aspirated freely following injection.  The needle was carefully withdrawn.  The patient tolerated the procedure well.  ? ? ? ?

## 2021-10-08 NOTE — Progress Notes (Signed)
Patient has iodine.  Mupirocin was administered instead.  ?

## 2021-10-08 NOTE — Evaluation (Signed)
Physical Therapy Evaluation ?Patient Details ?Name: Deborah Bolton ?MRN: [id number] ?DOB: [birth year] ?Today's Date: 10/08/2021 ? ?History of Present Illness ? Pt is a 61 y/o female s/p R THA. PMH includes DM.  ?Clinical Impression ? Pt is s/p surgery above with deficits below. Overall tolerated mobility well to chair using RW. Required min guard for safety. Anticipate pt will progress well. Will continue to follow acutely.    ?   ? ?Recommendations for follow up therapy are one component of a multi-disciplinary discharge planning process, led by the attending physician.  Recommendations may be updated based on patient status, additional functional criteria and insurance authorization. ? ?Follow Up Recommendations Follow physician's recommendations for discharge plan and follow up therapies ? ?  ?Assistance Recommended at Discharge Intermittent Supervision/Assistance  ?Patient can return home with the following ? Assistance with cooking/housework;Help with stairs or ramp for entrance;Assist for transportation ? ?  ?Equipment Recommendations None recommended by PT  ?Recommendations for Other Services ?    ?  ?Functional Status Assessment Patient has had a recent decline in their functional status and demonstrates the ability to make significant improvements in function in a reasonable and predictable amount of time.  ? ?  ?Precautions / Restrictions Precautions ?Precautions: Fall ?Restrictions ?Weight Bearing Restrictions: Yes ?RLE Weight Bearing: Weight bearing as tolerated  ? ?  ? ?Mobility ? Bed Mobility ?Overal bed mobility: Needs Assistance ?Bed Mobility: Supine to Sit ?  ?  ?Supine to sit: Supervision ?  ?  ?General bed mobility comments: Supervision for safety. Used bed rails ?  ? ?Transfers ?Overall transfer level: Needs assistance ?Equipment used: Rolling walker (2 wheels) ?Transfers: Sit to/from Stand, Bed to chair/wheelchair/BSC ?Sit to Stand: Min guard ?Stand pivot transfers: Min guard ?  ?  ?  ?  ?General  transfer comment: Min guard for safety to stand and transfer using RW. No overt LOB ?  ? ?Ambulation/Gait ?  ?  ?  ?  ?  ?  ?  ?  ? ?Stairs ?  ?  ?  ?  ?  ? ?Wheelchair Mobility ?  ? ?Modified Rankin (Stroke Patients Only) ?  ? ?  ? ?Balance Overall balance assessment: Needs assistance ?Sitting-balance support: No upper extremity supported, Feet supported ?Sitting balance-Leahy Scale: Good ?  ?  ?Standing balance support: Bilateral upper extremity supported ?Standing balance-Leahy Scale: Poor ?Standing balance comment: Reliant on BUE support ?  ?  ?  ?  ?  ?  ?  ?  ?  ?  ?  ?   ? ? ? ?Pertinent Vitals/Pain Pain Assessment ?Pain Assessment: Faces ?Faces Pain Scale: Hurts little more ?Pain Location: R hip ?Pain Descriptors / Indicators: Grimacing, Guarding ?Pain Intervention(s): Limited activity within patient's tolerance, Monitored during session, Repositioned  ? ? ?Home Living Family/patient expects to be discharged to:: Private residence ?Living Arrangements: Spouse/significant other ?Available Help at Discharge: Family ?Type of Home: House ?Home Access: Stairs to enter ?Entrance Stairs-Rails: None ?Entrance Stairs-Number of Steps: 3 ?  ?Home Layout: One level ?Home Equipment: Agricultural consultant (2 wheels);Rollator (4 wheels);Grab bars - tub/shower;Tub bench;Cane - single point ?   ?  ?Prior Function Prior Level of Function : Independent/Modified Independent ?  ?  ?  ?  ?  ?  ?Mobility Comments: Uses cane vs rollator vs RW depending on how shes feeling ?  ?  ? ? ?Hand Dominance  ?   ? ?  ?Extremity/Trunk Assessment  ? Upper Extremity Assessment ?Upper Extremity Assessment: Defer  to OT evaluation ?  ? ?Lower Extremity Assessment ?Lower Extremity Assessment: RLE deficits/detail;LLE deficits/detail ?RLE Deficits / Details: Deficits consistent with post op pain and weakness. ?LLE Deficits / Details: Previous weakness and numbness from nerve injury during previous surgery ?  ? ?Cervical / Trunk Assessment ?Cervical / Trunk  Assessment: Normal  ?Communication  ? Communication: No difficulties  ?Cognition Arousal/Alertness: Awake/alert ?Behavior During Therapy: [facility] for tasks assessed/performed ?Overall Cognitive Status: Within Functional Limits for tasks assessed ?  ?  ?  ?  ?  ?  ?  ?  ?  ?  ?  ?  ?  ?  ?  ?  ?  ?  ?  ? ?  ?General Comments   ? ?  ?Exercises    ? ?Assessment/Plan  ?  ?PT Assessment Patient needs continued PT services  ?PT Problem List Decreased strength;Decreased balance;Decreased mobility;Impaired sensation;Pain ? ?   ?  ?PT Treatment Interventions DME instruction;Stair training;Gait training;Functional mobility training;Therapeutic exercise;Therapeutic activities;Balance training;Patient/family education   ? ?PT Goals (Current goals can be found in the Care Plan section)  ?Acute Rehab PT Goals ?Patient Stated Goal: to be independent ?PT Goal Formulation: With patient ?Time For Goal Achievement: 10/22/21 ?Potential to Achieve Goals: Good ? ?  ?Frequency 7X/week ?  ? ? ?Co-evaluation   ?  ?  ?  ?  ? ? ?  ?AM-PAC PT "6 Clicks" Mobility  ?Outcome Measure Help needed turning from your back to your side while in a flat bed without using bedrails?: None ?Help needed moving from lying on your back to sitting on the side of a flat bed without using bedrails?: A Little ?Help needed moving to and from a bed to a chair (including a wheelchair)?: A Little ?Help needed standing up from a chair using your arms (e.g., wheelchair or bedside chair)?: A Little ?Help needed to walk in hospital room?: A Little ?Help needed climbing 3-5 steps with a railing? : A Little ?6 Click Score: 19 ? ?  ?End of Session Equipment Utilized During Treatment: Gait belt ?Activity Tolerance: Patient tolerated treatment well ?Patient left: in chair;with call bell/phone within reach ?Nurse Communication: Mobility status ?PT Visit Diagnosis: Unsteadiness on feet (R26.81);Muscle weakness (generalized) (M62.81);Other abnormalities of gait and mobility  (R26.89);Pain ?Pain - Right/Left: Right ?Pain - part of body: Hip ?  ? ?Time: 1610-9604 ?PT Time Calculation (min) (ACUTE ONLY): 17 min ? ? ?Charges:   PT Evaluation ?$PT Eval Low Complexity: 1 Low ?  ?  ?   ? ? ?Farley Ly, PT, DPT  ?Acute Rehabilitation Services  ?Pager: [phone] ?Office: [phone] ? ? ?[place] Jacquetta Polhamus ?10/08/2021, 5:06 PM ? ?

## 2021-10-09 ENCOUNTER — Encounter: Payer: Medicare PPO | Admitting: Orthopaedic Surgery

## 2021-10-09 ENCOUNTER — Encounter (HOSPITAL_COMMUNITY): Payer: Self-pay | Admitting: Orthopaedic Surgery

## 2021-10-09 DIAGNOSIS — M1611 Unilateral primary osteoarthritis, right hip: Secondary | ICD-10-CM | POA: Diagnosis not present

## 2021-10-09 LAB — CBC
HCT: 34.2 % — ABNORMAL LOW (ref 36.0–46.0)
Hemoglobin: 11.4 g/dL — ABNORMAL LOW (ref 12.0–15.0)
MCH: 28.4 pg (ref 26.0–34.0)
MCHC: 33.3 g/dL (ref 30.0–36.0)
MCV: 85.1 fL (ref 80.0–100.0)
Platelets: 262 10*3/uL (ref 150–400)
RBC: 4.02 MIL/uL (ref 3.87–5.11)
RDW: 14.9 % (ref 11.5–15.5)
WBC: 16.6 10*3/uL — ABNORMAL HIGH (ref 4.0–10.5)
nRBC: 0 % (ref 0.0–0.2)

## 2021-10-09 LAB — BASIC METABOLIC PANEL WITH GFR
Anion gap: 8 (ref 5–15)
BUN: 20 mg/dL (ref 8–23)
CO2: 25 mmol/L (ref 22–32)
Calcium: 8.6 mg/dL — ABNORMAL LOW (ref 8.9–10.3)
Chloride: 102 mmol/L (ref 98–111)
Creatinine, Ser: 0.9 mg/dL (ref 0.44–1.00)
GFR, Estimated: >60 mL/min
Glucose, Bld: 120 mg/dL — ABNORMAL HIGH (ref 70–99)
Potassium: 4.3 mmol/L (ref 3.5–5.1)
Sodium: 135 mmol/L (ref 135–145)

## 2021-10-09 LAB — GLUCOSE, CAPILLARY: Glucose-Capillary: 126 mg/dL — ABNORMAL HIGH (ref 70–99)

## 2021-10-09 NOTE — Progress Notes (Addendum)
Subjective: ?1 Day Post-Op Procedure(s) (LRB): ?RIGHT TOTAL HIP ARTHROPLASTY ANTERIOR APPROACH, INCISIONAL VAC (Right) ?Patient reports pain as mild.   ? ?Objective: ?Vital signs in last 24 hours: ?Temp:  [98.1 ?F (36.7 ?C)-99.4 ?F (37.4 ?C)] 99 ?F (37.2 ?C) (03/07 0732) ?Pulse Rate:  [65-94] 70 (03/07 0732) ?Resp:  [10-21] 18 (03/07 0732) ?BP: (97-172)/(55-94) 106/73 (03/07 0732) ?SpO2:  [91 %-96 %] 96 % (03/07 0732) ?Weight:  [102.1 kg] 102.1 kg (03/06 0904) ? ?Intake/Output from previous day: ?03/06 0701 - 03/07 0700 ?In: 2284.8 [P.O.:600; I.V.:1485; IV Piggyback:199.8] ?Out: 1250 [Urine:1000; Blood:250] ?Intake/Output this shift: ?No intake/output data recorded. ? ?Recent Labs  ?  10/09/21 ?0559  ?HGB 11.4*  ? ?Recent Labs  ?  10/09/21 ?0559  ?WBC 16.6*  ?RBC 4.02  ?HCT 34.2*  ?PLT 262  ? ?Recent Labs  ?  10/09/21 ?0559  ?NA 135  ?K 4.3  ?CL 102  ?CO2 25  ?BUN 20  ?CREATININE 0.90  ?GLUCOSE 120*  ?CALCIUM 8.6*  ? ?No results for input(s): LABPT, INR in the last 72 hours. ? ?Neurologically intact ?Neurovascular intact ?Sensation intact distally ?Intact pulses distally ?Dorsiflexion/Plantar flexion intact ?Incision: dressing C/D/I ?No cellulitis present ?Compartment soft ?Wound vac in place and functioning without fluid in canister ? ? ?Assessment/Plan: ?1 Day Post-Op Procedure(s) (LRB): ?RIGHT TOTAL HIP ARTHROPLASTY ANTERIOR APPROACH, INCISIONAL VAC (Right) ?Advance diet ?Up with therapy ?D/C IV fluids ?Discharge to SNF once ins approves and bed available (patient does not have any reliable help at home and does not feel safe alone) covid lab ordered ?ABLA- mild and stable ?WBAT RLE ?Please replace home vac with portable prevena just prior to d/c ?F/u with Dr. Roda Shutters in one week ? ? ? ? ? ?Cristie Hem ?10/09/2021, 8:12 AM ? ?

## 2021-10-09 NOTE — Plan of Care (Signed)
?  Problem: Safety: ?Goal: Ability to remain free from injury will improve ?Outcome: Completed/Met ?  ?Problem: Education: ?Goal: Knowledge of the prescribed therapeutic regimen will improve ?Outcome: Completed/Met ?Goal: Understanding of discharge needs will improve ?Outcome: Completed/Met ?Goal: Individualized Educational Video(s) ?Outcome: Completed/Met ?  ?Problem: Activity: ?Goal: Ability to avoid complications of mobility impairment will improve ?Outcome: Completed/Met ?Goal: Ability to tolerate increased activity will improve ?Outcome: Completed/Met ?  ?Problem: Clinical Measurements: ?Goal: Postoperative complications will be avoided or minimized ?Outcome: Completed/Met ?  ?Problem: Pain Management: ?Goal: Pain level will decrease with appropriate interventions ?Outcome: Completed/Met ?  ?Problem: Skin Integrity: ?Goal: Will show signs of wound healing ?Outcome: Completed/Met ?  ?

## 2021-10-09 NOTE — Progress Notes (Signed)
Patient awaiting transport via wheelchair by volunteer for discharge home; with complaints of pain and discomfort on her right hip and was medicated before leaving the unit; incision on her right hip with Prevena wound vac and is clean, dry and intact; room was checked and accounted for all her belongings; discharge instructions concerning her medications, incision care, follow up appointment and when to call the doctor as needed were all discussed with patient and husband by RN and both expressed understanding on the instructions given. ?

## 2021-10-09 NOTE — Progress Notes (Addendum)
Physical Therapy Treatment and Discharge ?Patient Details ?Name: Deborah Bolton ?MRN: [id number] ?DOB: [birth year] ?Today's Date: 10/09/2021 ? ? ?History of Present Illness Pt is a 62 y/o female s/p R THA. PMH includes DM. ? ?  ?PT Comments  ? ? Pt met her physical therapy goals during her inpatient stay. Reporting fair pain control. Session focused on performing bed level exercises for RLE ROM and strengthening (written instruction provided). Pt ambulating 400 feet with a walker, initially requiring supervision but progressing to a modified independent level with distance. Pt declining practicing stairs; reports she has a Raomi mobility aid walker at home which adjusts to the height of the stairs that she has used with good success in the past. Pt reporting decreased caregiver support, however, performed deep cleaning prior to admission and has grocery delivery available. She states she has two daughters who are also a half hour away. Discussed recommendation for HHPT and would likely benefit from eventual transition to OPPT. ?  ?Recommendations for follow up therapy are one component of a multi-disciplinary discharge planning process, led by the attending physician.  Recommendations may be updated based on patient status, additional functional criteria and insurance authorization. ? ?Follow Up Recommendations ? Follow physician's recommendations for discharge plan and follow up therapies ?  ?  ?Assistance Recommended at Discharge Intermittent Supervision/Assistance  ?Patient can return home with the following Assistance with cooking/housework;Help with stairs or ramp for entrance;Assist for transportation ?  ?Equipment Recommendations ? None recommended by PT  ?  ?Recommendations for Other Services   ? ? ?  ?Precautions / Restrictions Precautions ?Precautions: Fall ?Restrictions ?Weight Bearing Restrictions: Yes ?RLE Weight Bearing: Weight bearing as tolerated  ?  ? ?Mobility ? Bed Mobility ?Overal bed mobility:  Modified Independent ?  ?  ?  ?  ?  ?  ?  ?  ? ?Transfers ?Overall transfer level: Modified independent ?Equipment used: Rolling walker (2 wheels) ?  ?  ?  ?  ?  ?  ?  ?  ?  ? ?Ambulation/Gait ?Ambulation/Gait assistance: Supervision, Modified independent (Device/Increase time) ?Gait Distance (Feet): 400 Feet ?Assistive device: Rolling walker (2 wheels) ?Gait Pattern/deviations: Step-through pattern, Decreased dorsiflexion - right, Wide base of support ?  ?  ?  ?General Gait Details: Cues for right heel strike and neutral foot positioning. Pt with increased pronation in midstance (pt reporting baseline). Initially supervision, progressing to modI with distance ? ? ?Stairs ?  ?  ?  ?  ?  ? ? ?Wheelchair Mobility ?  ? ?Modified Rankin (Stroke Patients Only) ?  ? ? ?  ?Balance Overall balance assessment: Needs assistance ?Sitting-balance support: No upper extremity supported, Feet supported ?Sitting balance-Leahy Scale: Good ?  ?  ?Standing balance support: Bilateral upper extremity supported ?Standing balance-Leahy Scale: Poor ?Standing balance comment: Reliant on BUE support ?  ?  ?  ?  ?  ?  ?  ?  ?  ?  ?  ?  ? ?  ?Cognition Arousal/Alertness: Awake/alert ?Behavior During Therapy: [facility] for tasks assessed/performed ?Overall Cognitive Status: Within Functional Limits for tasks assessed ?  ?  ?  ?  ?  ?  ?  ?  ?  ?  ?  ?  ?  ?  ?  ?  ?  ?  ?  ? ?  ?Exercises Total Joint Exercises ?Quad Sets: Right, 10 reps, Supine ?Heel Slides: Right, 10 reps, Supine ?Hip ABduction/ADduction: Right, 10 reps, Supine ?Long Arc Quad: Both, 10 reps,  Seated ? ?  ?General Comments   ?  ?  ? ?Pertinent Vitals/Pain Pain Assessment ?Pain Assessment: Faces ?Faces Pain Scale: Hurts a little bit ?Pain Location: R hip ?Pain Descriptors / Indicators: Grimacing, Guarding ?Pain Intervention(s): Monitored during session  ? ? ?Home Living   ?  ?  ?  ?  ?  ?  ?  ?  ?  ?   ?  ?Prior Function    ?  ?  ?   ? ?PT Goals (current goals can now be found in the  care plan section) Acute Rehab PT Goals ?Patient Stated Goal: to be independent ?PT Goal Formulation: With patient ?Time For Goal Achievement: 10/22/21 ?Potential to Achieve Goals: Good ?Progress towards PT goals: Goals met/education completed, patient discharged from PT ? ?  ?Frequency ? ? ? 7X/week ? ? ? ?  ?PT Plan Current plan remains appropriate  ? ? ?Co-evaluation   ?  ?  ?  ?  ? ?  ?AM-PAC PT "6 Clicks" Mobility   ?Outcome Measure ? Help needed turning from your back to your side while in a flat bed without using bedrails?: None ?Help needed moving from lying on your back to sitting on the side of a flat bed without using bedrails?: None ?Help needed moving to and from a bed to a chair (including a wheelchair)?: None ?Help needed standing up from a chair using your arms (e.g., wheelchair or bedside chair)?: None ?Help needed to walk in hospital room?: None ?Help needed climbing 3-5 steps with a railing? : A Little ?6 Click Score: 23 ? ?  ?End of Session Equipment Utilized During Treatment: Gait belt ?Activity Tolerance: Patient tolerated treatment well ?Patient left: with call bell/phone within reach;in bed ?Nurse Communication: Mobility status ?PT Visit Diagnosis: Unsteadiness on feet (R26.81);Muscle weakness (generalized) (M62.81);Other abnormalities of gait and mobility (R26.89);Pain ?Pain - Right/Left: Right ?Pain - part of body: Hip ?  ? ? ?Time: 0211-1735 ?PT Time Calculation (min) (ACUTE ONLY): 43 min ? ?Charges:  $Gait Training: 8-22 mins ?$Therapeutic Exercise: 8-22 mins          ?          ? ?Wyona Almas, PT, DPT ?Acute Rehabilitation Services ?Pager [phone] ?Office [phone] ? ? ? ?Deno Etienne ?10/09/2021, 10:19 AM ? ?

## 2021-10-09 NOTE — Evaluation (Addendum)
Occupational Therapy Evaluation ?Patient Details ?Name: Deborah Bolton ?MRN: [id number] ?DOB: 05/10/1961 ?Today's Date: 10/09/2021 ? ? ?History of Present Illness Pt is a 61 y/o female s/p R THA. PMH includes DM.  ? ?Clinical Impression ?  ?Pt was evaluated s/p the above R THA. She is indep at baseline, lives with her husband who is able to assist as needed at d/c. Pt was educated on AE for LB ADLs, however she demonstrated good ability to complete without AE. Overall she was set up for sitting tasks and min G-min A for LB tasks with RW. Pt states her husband can assist as needed. Educated pt on wound vac management, she verbalized understanding. Pt does not required further acute OT acutely or at d/c. Recommend d/c home with support of family.  ?   ? ?Recommendations for follow up therapy are one component of a multi-disciplinary discharge planning process, led by the attending physician.  Recommendations may be updated based on patient status, additional functional criteria and insurance authorization.  ? ?Follow Up Recommendations ? Follow physician's recommendations for discharge plan and follow up therapies  ?  ?Assistance Recommended at Discharge Intermittent Supervision/Assistance  ?Patient can return home with the following A little help with walking and/or transfers;A little help with bathing/dressing/bathroom;Help with stairs or ramp for entrance;Assist for transportation ? ?  ?Functional Status Assessment ? Patient has had a recent decline in their functional status and/or demonstrates limited ability to make significant improvements in function in a reasonable and predictable amount of time  ?Equipment Recommendations ? None recommended by OT  ?  ?Recommendations for Other Services   ? ? ?  ?Precautions / Restrictions Precautions ?Precautions: Fall ?Restrictions ?Weight Bearing Restrictions: Yes ?RLE Weight Bearing: Weight bearing as tolerated  ? ?  ? ?Mobility Bed Mobility ?Overal bed mobility: Modified  Independent ?  ?  ?  ?  ?  ?  ?General bed mobility comments: educated on hooking method to get into the bed ?  ? ?Transfers ?Overall transfer level: Modified independent ?Equipment used: Rolling walker (2 wheels) ?  ?  ?  ?  ?  ?  ?  ?  ?  ? ?  ?Balance Overall balance assessment: Needs assistance ?Sitting-balance support: No upper extremity supported, Feet supported ?Sitting balance-Leahy Scale: Good ?  ?  ?Standing balance support: Single extremity supported, During functional activity ?Standing balance-Leahy Scale: Fair ?  ?  ?  ?  ?  ?  ?  ?  ?  ?  ?  ?  ?   ? ?ADL either performed or assessed with clinical judgement  ? ?ADL Overall ADL's : Needs assistance/impaired ?Eating/Feeding: Independent;Sitting ?  ?Grooming: Min guard;Standing ?Grooming Details (indicate cue type and reason): at the sink ?Upper Body Bathing: Set up;Sitting ?  ?Lower Body Bathing: Sit to/from stand;Minimal assistance ?  ?Upper Body Dressing : Set up;Sitting ?  ?Lower Body Dressing: Minimal assistance;Sit to/from stand ?Lower Body Dressing Details (indicate cue type and reason): educated on AE & wound vac management ?Toilet Transfer: Min guard;Rolling walker (2 wheels);Ambulation ?Toilet Transfer Details (indicate cue type and reason): slow, deliberate ?Toileting- Clothing Manipulation and Hygiene: Supervision/safety;Sitting/lateral lean ?  ?  ?  ?Functional mobility during ADLs: Min guard;Rolling walker (2 wheels) ?General ADL Comments: limited by expected hip pain. educated on wound vac management and compensatory techniques for ADLs  ? ? ? ?Vision Baseline Vision/History: 0 No visual deficits ?Ability to See in Adequate Light: 0 Adequate ?Patient Visual Report: No change from  baseline ?Vision Assessment?: No apparent visual deficits  ?   ?Perception   ?  ?Praxis   ?  ? ?Pertinent Vitals/Pain Pain Assessment ?Pain Assessment: Faces ?Faces Pain Scale: Hurts a little bit ?Pain Location: R hip ?Pain Descriptors / Indicators: Grimacing,  Guarding ?Pain Intervention(s): Limited activity within patient's tolerance  ? ? ? ?Hand Dominance Right ?  ?Extremity/Trunk Assessment Upper Extremity Assessment ?Upper Extremity Assessment: Overall WFL for tasks assessed ?  ?Lower Extremity Assessment ?Lower Extremity Assessment: Defer to PT evaluation ?  ?Cervical / Trunk Assessment ?Cervical / Trunk Assessment: Normal ?  ?Communication Communication ?Communication: No difficulties ?  ?Cognition Arousal/Alertness: Awake/alert ?Behavior During Therapy: [facility] for tasks assessed/performed ?Overall Cognitive Status: Within Functional Limits for tasks assessed ?  ?  ?  ?  ?  ?  ?  ?  ?  ?  ?  ?  ?  ?  ?  ?  ?  ?  ?  ?General Comments  VSS on RA ? ?  ?Exercises   ?  ?Shoulder Instructions    ? ? ?Home Living Family/patient expects to be discharged to:: Private residence ?Living Arrangements: Spouse/significant other ?Available Help at Discharge: Family ?Type of Home: House ?Home Access: Stairs to enter ?Entrance Stairs-Number of Steps: 3 ?Entrance Stairs-Rails: None ?Home Layout: One level ?  ?  ?Bathroom Shower/Tub: Tub/shower unit ?  ?Bathroom Toilet: Standard ?  ?  ?Home Equipment: Agricultural consultant (2 wheels);Rollator (4 wheels);Grab bars - tub/shower;Tub bench;Cane - single point ?  ?  ?  ? ?  ?Prior Functioning/Environment Prior Level of Function : Independent/Modified Independent ?  ?  ?  ?  ?  ?  ?Mobility Comments: Uses cane vs rollator vs RW depending on how shes feeling ?ADLs Comments: indep, drives, including IADLs ?  ? ?  ?  ?OT Problem List: Decreased strength;Decreased range of motion;Decreased activity tolerance;Impaired balance (sitting and/or standing);Decreased knowledge of use of DME or AE;Decreased knowledge of precautions ?  ?   ?OT Treatment/Interventions:    ?  ?OT Goals(Current goals can be found in the care plan section) Acute Rehab OT Goals ?Patient Stated Goal: home ?OT Goal Formulation: All assessment and education complete, DC therapy  ?OT  Frequency:   ?  ? ?Co-evaluation   ?  ?  ?  ?  ? ?  ?AM-PAC OT "6 Clicks" Daily Activity     ?Outcome Measure Help from another person eating meals?: None ?Help from another person taking care of personal grooming?: A Little ?Help from another person toileting, which includes using toliet, bedpan, or urinal?: A Little ?Help from another person bathing (including washing, rinsing, drying)?: A Little ?Help from another person to put on and taking off regular upper body clothing?: None ?Help from another person to put on and taking off regular lower body clothing?: A Little ?6 Click Score: 20 ?  ?End of Session Equipment Utilized During Treatment: Gait belt;Rolling walker (2 wheels) ?Nurse Communication: Mobility status ? ?Activity Tolerance: Patient tolerated treatment well ?Patient left: in bed ? ?OT Visit Diagnosis: Unsteadiness on feet (R26.81);Other abnormalities of gait and mobility (R26.89);Pain;Muscle weakness (generalized) (M62.81)  ?              ?Time: 8527-7824 ?OT Time Calculation (min): 20 min ?Charges:  OT General Charges ?$OT Visit: 1 Visit ?OT Evaluation ?$OT Eval Moderate Complexity: 1 Mod ? ? ?Achilles Neville A Luisfelipe [place]elstad ?10/09/2021, 12:40 PM ?

## 2021-10-09 NOTE — Discharge Summary (Signed)
Patient ID: Deborah Bolton MRN: [id number] DOB/AGE: [birth year] 61 y.o.  Admit date: 10/08/2021 Discharge date: 10/09/2021  Admission Diagnoses:  Principal Problem:   Primary osteoarthritis of right hip Active Problems:   Status post total replacement of right hip   Discharge Diagnoses:  Same  Past Medical History:  Diagnosis Date   Arthritis    Asthma    Complication of anesthesia    says she is "sensitive" to sedating medications   Depression    Diabetes mellitus without complication (HCC)    Dyspnea    History of kidney stones     Surgeries: Procedure(s): RIGHT TOTAL HIP ARTHROPLASTY ANTERIOR APPROACH, INCISIONAL VAC on 10/08/2021   Consultants:   Discharged Condition: Improved  Hospital Course: Deborah Bolton is an 61 y.o. female who was admitted 10/08/2021 for operative treatment ofPrimary osteoarthritis of right hip. Patient has severe unremitting pain that affects sleep, daily activities, and work/hobbies. After pre-op clearance the patient was taken to the operating room on 10/08/2021 and underwent  Procedure(s): RIGHT TOTAL HIP ARTHROPLASTY ANTERIOR APPROACH, INCISIONAL VAC.    Patient was given perioperative antibiotics:  Anti-infectives (From admission, onward)    Start     Dose/Rate Route Frequency Ordered Stop   10/08/21 1815  ceFAZolin (ANCEF) IVPB 2g/100 mL premix        2 g 200 mL/hr over 30 Minutes Intravenous Every 6 hours 10/08/21 1715 10/09/21 0005   10/08/21 1400  ceFAZolin (ANCEF) IVPB 2g/100 mL premix  Status:  Discontinued        2 g 200 mL/hr over 30 Minutes Intravenous Every 6 hours 10/08/21 1350 10/08/21 1715   10/08/21 1301  vancomycin (VANCOCIN) powder  Status:  Discontinued          As needed 10/08/21 1301 10/08/21 1339   10/08/21 0900  ceFAZolin (ANCEF) IVPB 2g/100 mL premix        2 g 200 mL/hr over 30 Minutes Intravenous On call to O.R. 10/08/21 0859 10/08/21 1210        Patient was given sequential compression devices, early ambulation,  and chemoprophylaxis to prevent DVT.  Patient benefited maximally from hospital stay and there were no complications.    Recent vital signs: Patient Vitals for the past 24 hrs:  BP Temp Temp src Pulse Resp SpO2 Height Weight  10/09/21 0732 106/73 99 F (37.2 C) Oral 70 18 96 % -- --  10/09/21 0345 119/66 98.7 F (37.1 C) Oral 80 20 93 % -- --  10/08/21 2318 (!) 113/55 99.4 F (37.4 C) Oral 81 18 91 % -- --  10/08/21 1938 (!) 144/80 99 F (37.2 C) Oral 94 18 93 % -- --  10/08/21 1622 (!) 146/86 98.9 F (37.2 C) Oral 79 20 96 % -- --  10/08/21 1600 (!) 149/84 -- -- 74 13 96 % -- --  10/08/21 1545 135/80 98.4 F (36.9 C) -- 72 18 95 % -- --  10/08/21 1530 140/86 -- -- 68 13 95 % -- --  10/08/21 1515 (!) 130/59 -- -- 70 20 96 % -- --  10/08/21 1500 134/77 -- -- 69 13 94 % -- --  10/08/21 1445 133/70 -- -- 69 15 95 % -- --  10/08/21 1430 113/71 -- -- 67 12 96 % -- --  10/08/21 1415 115/71 -- -- 65 10 94 % -- --  10/08/21 1400 110/68 -- -- 69 12 94 % -- --  10/08/21 1346 -- -- -- 74 14 96 % -- --  10/08/21 1345 97/65 98.4 F (36.9 C) -- 75 (!) 21 95 % -- --  10/08/21 0904 (!) 172/94 98.1 F (36.7 C) Oral 76 17 96 % 5\' 5"  (1.651 m) 102.1 kg     Recent laboratory studies:  Recent Labs    10/09/21 0559  WBC 16.6*  HGB 11.4*  HCT 34.2*  PLT 262  NA 135  K 4.3  CL 102  CO2 25  BUN 20  CREATININE 0.90  GLUCOSE 120*  CALCIUM 8.6*     Discharge Medications:   Allergies as of 10/09/2021       Reactions   Iodine    Betadine blisters, chemical burns    Other    General anesthesia - takes a long time to wake up         Medication List     STOP taking these medications    celecoxib 200 MG capsule Commonly known as: CELEBREX   ibuprofen 200 MG tablet Commonly known as: ADVIL       TAKE these medications    albuterol 108 (90 Base) MCG/ACT inhaler Commonly known as: VENTOLIN HFA Inhale 2 puffs into the lungs every 6 (six) hours as needed for wheezing or  shortness of breath.   albuterol (2.5 MG/3ML) 0.083% nebulizer solution Commonly known as: PROVENTIL Take 2.5 mg by nebulization every 6 (six) hours as needed for wheezing or shortness of breath.   aspirin EC 81 MG tablet Take 1 tablet (81 mg total) by mouth 2 (two) times daily. To be taken after surgery to prevent blood clots   Biotin 5 MG Caps Take 5 mg by mouth daily.   CoQ-10 100 MG Caps Take 100 mg by mouth at bedtime.   docusate sodium 100 MG capsule Commonly known as: Colace Take 1 capsule (100 mg total) by mouth daily as needed.   ferrous sulfate 325 (65 FE) MG tablet Take 325 mg by mouth at bedtime.   FLUoxetine 20 MG capsule Commonly known as: PROZAC Take 20 mg by mouth at bedtime.   fluticasone 50 MCG/ACT nasal spray Commonly known as: FLONASE Place 1 spray into both nostrils daily as needed for allergies or rhinitis.   Garlic 1000 MG Caps Take 1,000 mg by mouth daily.   Magnesium 500 MG Tabs Take 500 mg by mouth daily.   metFORMIN 1000 MG tablet Commonly known as: GLUCOPHAGE Take 1,000 mg by mouth daily with breakfast.   methocarbamol 500 MG tablet Commonly known as: Robaxin Take 1 tablet (500 mg total) by mouth 2 (two) times daily as needed.   multivitamin with minerals Tabs tablet Take 1 tablet by mouth daily.   ondansetron 4 MG tablet Commonly known as: Zofran Take 1 tablet (4 mg total) by mouth every 8 (eight) hours as needed for nausea or vomiting.   oxybutynin 15 MG 24 hr tablet Commonly known as: DITROPAN XL Take 15 mg by mouth 2 (two) times daily.   oxyCODONE-acetaminophen 5-325 MG tablet Commonly known as: Percocet Take 1-2 tablets by mouth every 6 (six) hours as needed. To be taken after surgery   Quercetin 500 MG Caps Take 500 mg by mouth daily.   sulfamethoxazole-trimethoprim 800-160 MG tablet Commonly known as: BACTRIM DS Take 1 tablet by mouth 2 (two) times daily. To be taken after surgery   Trulicity 0.75 MG/0.5ML  Sopn Generic drug: Dulaglutide Inject 7.5 mg into the skin every Wednesday.   vitamin C 1000 MG tablet Take 1,000 mg by mouth daily.   zinc  gluconate 50 MG tablet Take 50 mg by mouth daily.               Durable Medical Equipment  (From admission, onward)           Start     Ordered   10/08/21 1617  DME Walker rolling  Once       Question:  Patient needs a walker to treat with the following condition  Answer:  History of hip replacement   10/08/21 1616   10/08/21 1617  DME 3 n 1  Once        10/08/21 1616   10/08/21 1617  DME Bedside commode  Once       Question:  Patient needs a bedside commode to treat with the following condition  Answer:  History of hip replacement   10/08/21 1616            Diagnostic Studies: DG Pelvis Portable  Result Date: 10/08/2021 CLINICAL DATA:  Postop for right hip arthroplasty EXAM: PORTABLE PELVIS 1-2 VIEWS COMPARISON:  12/06/2019 FINDINGS: Bilateral hip arthroplasty. New on the right. No hardware complication or periprosthetic fracture. Degenerative sclerosis of both sacroiliac joints is slightly greater on the left than right. IMPRESSION: Expected appearance after interval right hip arthroplasty. Electronically Signed   By: Jeronimo Greaves M.D.   On: 10/08/2021 14:12   DG C-Arm 1-60 Min-No Report  Result Date: 10/08/2021 Fluoroscopy was utilized by the requesting physician.  No radiographic interpretation.   DG HIP UNILAT WITH PELVIS 1V RIGHT  Result Date: 10/08/2021 CLINICAL DATA:  Right total hip arthroplasty. EXAM: DG HIP (WITH OR WITHOUT PELVIS) 1V RIGHT; DG C-ARM 1-60 MIN-NO REPORT Radiation exposure index: 5.8124 mGy. COMPARISON:  August 23, 2021. FINDINGS: Two intraoperative fluoroscopic images were obtained of the right hip. The right femoral and tibial components are well situated. IMPRESSION: Fluoroscopic guidance provided during right total hip arthroplasty. Electronically Signed   By: Lupita Raider M.D.   On: 10/08/2021  13:33    Disposition: Discharge disposition: 03-Skilled Nursing Facility          Follow-up Information     Tarry Kos, MD. Schedule an appointment as soon as possible for a visit in 2 week(s).   Specialty: Orthopedic Surgery Contact information: [address] [place] [address] [phone]         [facility] Follow up.   Specialty: [facility] Contact information: [address] [place] [address] [place] [address] [phone]                  Signed: Cristie Hem 10/09/2021, 8:15 AM

## 2021-10-16 ENCOUNTER — Other Ambulatory Visit: Payer: Self-pay

## 2021-10-16 ENCOUNTER — Ambulatory Visit (INDEPENDENT_AMBULATORY_CARE_PROVIDER_SITE_OTHER): Payer: Medicare PPO | Admitting: Physician Assistant

## 2021-10-16 DIAGNOSIS — Z96641 Presence of right artificial hip joint: Secondary | ICD-10-CM

## 2021-10-16 NOTE — Progress Notes (Signed)
? ?  Post-Op Visit Note ?  ?Patient: Deborah Bolton           ?Date of Birth: 1961-01-25           ?MRN: [id number] ?Visit Date: 10/16/2021 ?PCP: Hortencia Conradi, NP ? ? ?Assessment & Plan: ? ?Chief Complaint:  ?Chief Complaint  ?Patient presents with  ? Right Hip - Routine Post Op  ? ?Visit Diagnoses:  ?1. History of total replacement of right hip   ? ? ?Plan: Patient is a pleasant 61 year old female who comes in today 1 week status post right total hip replacement 10/08/2021.  She has been wearing a Prevena wound VAC to the right hip since surgery.  She has not noticed any fluid in the canister.  She has minimal pain but has been having a feeling of unwellness since surgery.  She has not been taking pain medication.  She has been compliant taking her baby aspirin twice daily for DVT prophylaxis.  She notes that her temperature is usually around 97 ?F but recently it has been around 99.8 in the afternoons.  She has had normal bowel movements.  No urinary symptoms.  No chest pain or shortness of breath or any other systemic symptoms.  Examination of the right hip without the wound VAC reveals a well-healing surgical incision with nylon sutures in place.  No evidence of infection or cellulitis.  Calves are soft and nontender.  She is neurovascular intact distally.  At this point, her wound looks good.  We have applied an Aquacel bandage.  She will follow-up with [place] next week for suture removal.  I have discussed with the patient that I would reach out to her primary care provider for further evaluation of not feeling well.  Call with concerns or questions in meantime. ? ?Follow-Up Instructions: Return in about 1 week (around 10/23/2021).  ? ?Orders:  ?No orders of the defined types were placed in this encounter. ? ?No orders of the defined types were placed in this encounter. ? ? ?Imaging: ?No new imaging ? ?PMFS History: ?Patient Active Problem List  ? Diagnosis Date Noted  ? Primary osteoarthritis of right hip 10/08/2021   ? Status post total replacement of right hip 10/08/2021  ? ALLERGIC RHINITIS 01/12/2008  ? ASTHMA 01/12/2008  ? HEADACHE, CHRONIC 01/12/2008  ? ?Past Medical History:  ?Diagnosis Date  ? Arthritis   ? Asthma   ? Complication of anesthesia   ? says she is "sensitive" to sedating medications  ? Depression   ? Diabetes mellitus without complication (HCC)   ? Dyspnea   ? History of kidney stones   ?  ?No family history on file.  ?Past Surgical History:  ?Procedure Laterality Date  ? JOINT REPLACEMENT    ? TOTAL HIP ARTHROPLASTY Right 10/08/2021  ? Procedure: RIGHT TOTAL HIP ARTHROPLASTY ANTERIOR APPROACH, INCISIONAL VAC;  Surgeon: Tarry Kos, MD;  Location: [facility] OR;  Service: Orthopedics;  Laterality: Right;  C-3  ? TUBAL LIGATION    ? ?Social History  ? ?Occupational History  ? Not on file  ?Tobacco Use  ? Smoking status: Never  ? Smokeless tobacco: Never  ?Vaping Use  ? Vaping Use: Not on file  ?Substance and Sexual Activity  ? Alcohol use: Not Currently  ? Drug use: Not Currently  ? Sexual activity: Not on file  ? ? ? ?

## 2021-10-23 ENCOUNTER — Other Ambulatory Visit: Payer: Self-pay

## 2021-10-23 ENCOUNTER — Ambulatory Visit (INDEPENDENT_AMBULATORY_CARE_PROVIDER_SITE_OTHER): Payer: Medicare PPO | Admitting: Physician Assistant

## 2021-10-23 ENCOUNTER — Encounter: Payer: Self-pay | Admitting: Orthopaedic Surgery

## 2021-10-23 DIAGNOSIS — Z96641 Presence of right artificial hip joint: Secondary | ICD-10-CM

## 2021-10-23 NOTE — Progress Notes (Signed)
? ?  Post-Op Visit Note ?  ?Patient: Deborah Bolton           ?Date of Birth: 1961-04-18           ?MRN: [id number] ?Visit Date: 10/23/2021 ?PCP: Zoila Shutter, NP ? ? ?Assessment & Plan: ? ?Chief Complaint:  ?Chief Complaint  ?Patient presents with  ? Right Hip - Routine Post Op, Pain  ? ?Visit Diagnoses:  ?1. History of total hip replacement, right   ? ? ?Plan: Patient is a pleasant 61 year old female who comes in today 2 weeks status post right total hip replacement 10/08/2021.  She has been doing well.  She has 1 home health physical therapy visit left.  She is ambulating with a single-point cane.  She is taking over-the-counter medication for pain.  Examination of the right hip reveals a fully healed surgical scar without complication.  Calves are soft and nontender.  She is neurovascular tact distally.  Today, sutures removed and Steri-Strips applied.  Continue with her home exercise program.  Continue with her DVT prophylaxis.  Follow-up in 4 weeks time for repeat evaluation AP pelvis x-rays. ? ?Follow-Up Instructions: Return in about 4 weeks (around 11/20/2021).  ? ?Orders:  ?No orders of the defined types were placed in this encounter. ? ?No orders of the defined types were placed in this encounter. ? ? ?Imaging: ?No new imaging ? ?PMFS History: ?Patient Active Problem List  ? Diagnosis Date Noted  ? Primary osteoarthritis of right hip 10/08/2021  ? Status post total replacement of right hip 10/08/2021  ? ALLERGIC RHINITIS 01/12/2008  ? ASTHMA 01/12/2008  ? HEADACHE, CHRONIC 01/12/2008  ? ?Past Medical History:  ?Diagnosis Date  ? Arthritis   ? Asthma   ? Complication of anesthesia   ? says she is "sensitive" to sedating medications  ? Depression   ? Diabetes mellitus without complication ([place])   ? Dyspnea   ? History of kidney stones   ?  ?History reviewed. No pertinent family history.  ?Past Surgical History:  ?Procedure Laterality Date  ? JOINT REPLACEMENT    ? TOTAL HIP ARTHROPLASTY Right 10/08/2021  ?  Procedure: RIGHT TOTAL HIP ARTHROPLASTY ANTERIOR APPROACH, INCISIONAL VAC;  Surgeon: Leandrew Koyanagi, MD;  Location: [facility];  Service: Orthopedics;  Laterality: Right;  C-3  ? TUBAL LIGATION    ? ?Social History  ? ?Occupational History  ? Not on file  ?Tobacco Use  ? Smoking status: Never  ? Smokeless tobacco: Never  ?Vaping Use  ? Vaping Use: Not on file  ?Substance and Sexual Activity  ? Alcohol use: Not Currently  ? Drug use: Not Currently  ? Sexual activity: Not on file  ? ? ? ?

## 2021-11-20 ENCOUNTER — Encounter: Payer: Self-pay | Admitting: Orthopaedic Surgery

## 2021-11-20 ENCOUNTER — Ambulatory Visit (INDEPENDENT_AMBULATORY_CARE_PROVIDER_SITE_OTHER): Payer: Medicare PPO | Admitting: Orthopaedic Surgery

## 2021-11-20 ENCOUNTER — Ambulatory Visit (INDEPENDENT_AMBULATORY_CARE_PROVIDER_SITE_OTHER): Payer: Medicare PPO

## 2021-11-20 DIAGNOSIS — Z96641 Presence of right artificial hip joint: Secondary | ICD-10-CM

## 2021-11-20 NOTE — Progress Notes (Signed)
? ?  Post-Op Visit Note ?  ?Patient: Deborah Bolton           ?Date of Birth: [birth year]           ?MRN: [id number] ?Visit Date: 11/20/2021 ?PCP: Deborah Conradi, NP ? ? ?Assessment & Plan: ? ?Chief Complaint:  ?Chief Complaint  ?Patient presents with  ? Right Hip - Follow-up  ?  Right total hip arthroplasty 10/08/2021  ? ?Visit Diagnoses:  ?1. History of total hip replacement, right   ? ? ?Plan: Deborah Bolton is 6 weeks status post right total hip replacement on 10/08/2021.  She is doing very well has no complaints. ? ?Examination of right hip shows a healed surgical scar.  Excellent strength and ambulation and gait. ? ?Deborah Bolton is doing very well from her surgery and overall she is very pleased.  Dental prophylaxis reinforced.  Wound care instructions were reinforced.  She may drive and continue to increase activity as tolerated.  She may discontinue aspirin for DVT prophylaxis.  Recheck in 6 weeks for 41-month visit. ?We briefly talked about timing for knee replacement surgery for end of summer. ? ?Follow-Up Instructions: No follow-ups on file.  ? ?Orders:  ?Orders Placed This Encounter  ?Procedures  ? XR Pelvis 1-2 Views  ? ?No orders of the defined types were placed in this encounter. ? ? ?Imaging: ?XR Pelvis 1-2 Views ? ?Result Date: 11/20/2021 ?Stable total hip replacement without complications  ? ?PMFS History: ?Patient Active Problem List  ? Diagnosis Date Noted  ? Primary osteoarthritis of right hip 10/08/2021  ? Status post total replacement of right hip 10/08/2021  ? ALLERGIC RHINITIS 01/12/2008  ? ASTHMA 01/12/2008  ? HEADACHE, CHRONIC 01/12/2008  ? ?Past Medical History:  ?Diagnosis Date  ? Arthritis   ? Asthma   ? Complication of anesthesia   ? says she is "sensitive" to sedating medications  ? Depression   ? Diabetes mellitus without complication ([facility])   ? Dyspnea   ? History of kidney stones   ?  ?No family history on file.  ?Past Surgical History:  ?Procedure Laterality Date  ? JOINT REPLACEMENT    ? TOTAL HIP  ARTHROPLASTY Right 10/08/2021  ? Procedure: RIGHT TOTAL HIP ARTHROPLASTY ANTERIOR APPROACH, INCISIONAL VAC;  Surgeon: Tarry Kos, MD;  Location: [facility];  Service: Orthopedics;  Laterality: Right;  C-3  ? TUBAL LIGATION    ? ?Social History  ? ?Occupational History  ? Not on file  ?Tobacco Use  ? Smoking status: Never  ? Smokeless tobacco: Never  ?Vaping Use  ? Vaping Use: Not on file  ?Substance and Sexual Activity  ? Alcohol use: Not Currently  ? Drug use: Not Currently  ? Sexual activity: Not on file  ? ? ? ?

## 2022-01-01 ENCOUNTER — Ambulatory Visit (INDEPENDENT_AMBULATORY_CARE_PROVIDER_SITE_OTHER): Payer: Medicare PPO | Admitting: Orthopaedic Surgery

## 2022-01-01 ENCOUNTER — Encounter: Payer: Self-pay | Admitting: Orthopaedic Surgery

## 2022-01-01 DIAGNOSIS — M1711 Unilateral primary osteoarthritis, right knee: Secondary | ICD-10-CM

## 2022-01-01 DIAGNOSIS — Z96641 Presence of right artificial hip joint: Secondary | ICD-10-CM

## 2022-01-01 NOTE — Progress Notes (Signed)
Office Visit Note   Patient: Deborah Bolton           Date of Birth: 1961-03-23           MRN: [id number] Visit Date: 01/01/2022              Requested by: Zoila Shutter, NP [address] [place],  [place] [address] PCP: Zoila Shutter, NP   Assessment & Plan: Visit Diagnoses:  1. H/O total hip arthroplasty, right   2. Unilateral primary osteoarthritis, right knee     Plan: Impression is 3 months status post right total hip replacement and right knee advanced degenerative joint disease.  In regards to her hip, she is doing well.  Dental prophylaxis reinforced.  Follow-up in 3 months time for repeat evaluation and AP pelvis x-rays.  In regards to her right knee, she has chronic pain which has not been relieved with NSAIDs or cortisone injections in the past.  She would like to proceed with total knee arthroplasty.  Risk benefits and possible complications reviewed.  Rehab and recovery time discussed.  All questions were answered.  She is a diabetic with her last hemoglobin A1c of 5.9 little less than 3 months ago.  We will have her return in July for repeat hemoglobin A1c as well as 2 view x-rays of the right knee as she would like to plan for surgery in August.  She denies a nickel allergy.  We will have Deborah Bolton call her to schedule this.  Call with concerns or questions in the meantime.  Follow-Up Instructions: Return in about 3 months (around 04/03/2022).   Orders:  No orders of the defined types were placed in this encounter.  No orders of the defined types were placed in this encounter.     Procedures: No procedures performed   Clinical Data: No additional findings.   Subjective: Chief Complaint  Patient presents with   Right Hip - Pain    HPI patient is a pleasant 61 year old female who comes in today 3 months status post right total hip replacement 10/08/2021.  She has been doing excellent.  No pain.  She does bring up right knee pain.  History of underlying  advanced osteoarthritis for which she is initially been seen by Dr. Marlou Sa.  She has had cortisone injections in the past without relief.  With pain she has is to the entire knee worse with activity.  She also has pain at night.  She has been taking Celebrex without relief.  At this point, she is interested in total knee arthroplasty.  Review of Systems as detailed in HPI.  All others reviewed are negative.   Objective: Vital Signs: There were no vitals taken for this visit.  Physical Exam well-developed well-nourished female no acute distress.  Alert and oriented x3.  Ortho Exam right knee exam shows no effusion.  Range of motion 0 to 110 degrees.  No joint line tenderness..  Right hip exam shows painless logroll.  Full hip flexion.  She is neurovascular intact distally.  Specialty Comments:  No specialty comments available.  Imaging: No new imaging   PMFS History: Patient Active Problem List   Diagnosis Date Noted   Primary osteoarthritis of right hip 10/08/2021   Status post total replacement of right hip 10/08/2021   ALLERGIC RHINITIS 01/12/2008   ASTHMA 01/12/2008   HEADACHE, CHRONIC 01/12/2008   Past Medical History:  Diagnosis Date   Arthritis    Asthma    Complication  of anesthesia    says she is "sensitive" to sedating medications   Depression    Diabetes mellitus without complication ([place])    Dyspnea    History of kidney stones     History reviewed. No pertinent family history.  Past Surgical History:  Procedure Laterality Date   JOINT REPLACEMENT     TOTAL HIP ARTHROPLASTY Right 10/08/2021   Procedure: RIGHT TOTAL HIP ARTHROPLASTY ANTERIOR APPROACH, INCISIONAL VAC;  Surgeon: Leandrew Koyanagi, MD;  Location: [place] [facility];  Service: Orthopedics;  Laterality: Right;  C-3   TUBAL LIGATION     Social History   Occupational History   Not on file  Tobacco Use   Smoking status: Never   Smokeless tobacco: Never  Vaping Use   Vaping Use: Not on file  Substance and Sexual  Activity   Alcohol use: Not Currently   Drug use: Not Currently   Sexual activity: Not on file

## 2022-02-11 ENCOUNTER — Ambulatory Visit (INDEPENDENT_AMBULATORY_CARE_PROVIDER_SITE_OTHER): Payer: Medicare PPO

## 2022-02-11 DIAGNOSIS — M1711 Unilateral primary osteoarthritis, right knee: Secondary | ICD-10-CM

## 2022-02-11 NOTE — Progress Notes (Signed)
Patient came in today for 2 view knee x-ray and a1c check.

## 2022-02-12 LAB — HEMOGLOBIN A1C
Hgb A1c MFr Bld: 6.1 % of total Hgb — ABNORMAL HIGH (ref <5.7)
Mean Plasma Glucose: 128 mg/dL
eAG (mmol/L): 7.1 mmol/L

## 2022-02-25 ENCOUNTER — Other Ambulatory Visit: Payer: Self-pay

## 2022-03-08 NOTE — Pre-Procedure Instructions (Addendum)
Surgical Instructions    Your procedure is scheduled on March 18, 2022.  Report to [facility] Main Entrance "A" at 6:30 A.M., then check in with the Admitting office.  Call this number if you have problems the morning of surgery:  [phone]   If you have any questions prior to your surgery date call [phone]: Open Monday-Friday 8am-4pm    Remember:  Do not eat after midnight the night before your surgery  You may drink clear liquids until 5:30 AM the morning of your surgery.   Clear liquids allowed are: Water, Non-Citrus Juices (without pulp), Carbonated Beverages, Clear Tea, Black Coffee Only (NO MILK, CREAM OR POWDERED CREAMER of any kind), and Gatorade.  Patient Instructions  The night before surgery:  No food after midnight. ONLY clear liquids after midnight  The day of surgery (if you have diabetes): Drink ONE (1) 12 oz G2 given to you in your pre admission testing appointment by 5:30 AM the morning of surgery. Drink in one sitting. Do not sip.  This drink was given to you during your hospital  pre-op appointment visit.  Nothing else to drink after completing the  12 oz bottle of G2.         If you have questions, please contact your surgeon's office.     Take these medicines the morning of surgery with A SIP OF WATER:  fluticasone (FLONASE)   oxybutynin (DITROPAN XL)  albuterol (VENTOLIN HFA) - take as needed. Bring inhaler with you to hospital morning of surgery.   As of today, STOP taking any Aspirin (unless otherwise instructed by your surgeon) Aleve, Naproxen, Ibuprofen, Motrin, Advil, Goody's, BC's, all herbal medications, fish oil, and all vitamins. This includes your medication: celecoxib (CELEBREX)   WHAT DO I DO ABOUT MY DIABETES MEDICATION?   The day of surgery, do not take TRULICITY   HOW TO MANAGE YOUR DIABETES BEFORE AND AFTER SURGERY  Why is it important to control my blood sugar before and after surgery? Improving blood sugar levels  before and after surgery helps healing and can limit problems. A way of improving blood sugar control is eating a healthy diet by:  Eating less sugar and carbohydrates  Increasing activity/exercise  Talking with your doctor about reaching your blood sugar goals High blood sugars (greater than 180 mg/dL) can raise your risk of infections and slow your recovery, so you will need to focus on controlling your diabetes during the weeks before surgery. Make sure that the doctor who takes care of your diabetes knows about your planned surgery including the date and location.  How do I manage my blood sugar before surgery? Check your blood sugar at least 4 times a day, starting 2 days before surgery, to make sure that the level is not too high or low.  Check your blood sugar the morning of your surgery when you wake up and every 2 hours until you get to the Short Stay unit.  If your blood sugar is less than 70 mg/dL, you will need to treat for low blood sugar: Do not take insulin. Treat a low blood sugar (less than 70 mg/dL) with  cup of clear juice (cranberry or apple), 4 glucose tablets, OR glucose gel. Recheck blood sugar in 15 minutes after treatment (to make sure it is greater than 70 mg/dL). If your blood sugar is not greater than 70 mg/dL on recheck, call [phone] for further instructions. Report your blood sugar to the short stay nurse when you get  to Short Stay.  If you are admitted to the hospital after surgery: Your blood sugar will be checked by the staff and you will probably be given insulin after surgery (instead of oral diabetes medicines) to make sure you have good blood sugar levels. The goal for blood sugar control after surgery is 80-180 mg/dL.                      Do NOT Smoke (Tobacco/Vaping) for 24 hours prior to your procedure.  If you use a CPAP at night, you may bring your mask/headgear for your overnight stay.   Contacts, glasses, piercing's, hearing aid's,  dentures or partials may not be worn into surgery, please bring cases for these belongings.    For patients admitted to the hospital, discharge time will be determined by your treatment team.   Patients discharged the day of surgery will not be allowed to drive home, and someone needs to stay with them for 24 hours.  SURGICAL WAITING ROOM VISITATION Patients having surgery or a procedure may have no more than 2 support people in the waiting area - these visitors may rotate.   Children under the age of 40 must have an adult with them who is not the patient. If the patient needs to stay at the hospital during part of their recovery, the visitor guidelines for inpatient rooms apply. Pre-op nurse will coordinate an appropriate time for 1 support person to accompany patient in pre-op.  This support person may not rotate.   Please refer to the [facility] website for the visitor guidelines for Inpatients (after your surgery is over and you are in a regular room).    Special instructions:   [facility]- Preparing For Surgery  Before surgery, you can play an important role. Because skin is not sterile, your skin needs to be as free of germs as possible. You can reduce the number of germs on your skin by washing with CHG (chlorahexidine gluconate) Soap before surgery.  CHG is an antiseptic cleaner which kills germs and bonds with the skin to continue killing germs even after washing.    Oral Hygiene is also important to reduce your risk of infection.  Remember - BRUSH YOUR TEETH THE MORNING OF SURGERY WITH YOUR REGULAR TOOTHPASTE  Please do not use if you have an allergy to CHG or antibacterial soaps. If your skin becomes reddened/irritated stop using the CHG.  Do not shave (including legs and underarms) for at least 48 hours prior to first CHG shower. It is OK to shave your face.  Please follow these instructions carefully.   Shower the NIGHT BEFORE SURGERY and the MORNING OF SURGERY  If you  chose to wash your hair, wash your hair first as usual with your normal shampoo.  After you shampoo, rinse your hair and body thoroughly to remove the shampoo.  Use CHG Soap as you would any other liquid soap. You can apply CHG directly to the skin and wash gently with a scrungie or a clean washcloth.   Apply the CHG Soap to your body ONLY FROM THE NECK DOWN.  Do not use on open wounds or open sores. Avoid contact with your eyes, ears, mouth and genitals (private parts). Wash Face and genitals (private parts)  with your normal soap.   Wash thoroughly, paying special attention to the area where your surgery will be performed.  Thoroughly rinse your body with warm water from the neck down.  DO NOT shower/wash  with your normal soap after using and rinsing off the CHG Soap.  Pat yourself dry with a CLEAN TOWEL.  Wear CLEAN PAJAMAS to bed the night before surgery  Place CLEAN SHEETS on your bed the night before your surgery  DO NOT SLEEP WITH PETS.   Day of Surgery: Take a shower with CHG soap. Do not wear jewelry or makeup Do not wear lotions, powders, perfumes/colognes, or deodorant. Do not shave 48 hours prior to surgery.   Do not bring valuables to the hospital.  [facility] is not responsible for any belongings or valuables. Do not wear nail polish, gel polish, artificial nails, or any other type of covering on natural nails (fingers and toes) If you have artificial nails or gel coating that need to be removed by a nail salon, please have this removed prior to surgery. Artificial nails or gel coating may interfere with anesthesia's ability to adequately monitor your vital signs.  Wear Clean/Comfortable clothing the morning of surgery Remember to brush your teeth WITH YOUR REGULAR TOOTHPASTE.   Please read over the following fact sheets that you were given.    If you received a COVID test during your pre-op visit  it is requested that you wear a mask when out in public, stay  away from anyone that may not be feeling well and notify your surgeon if you develop symptoms. If you have been in contact with anyone that has tested positive in the last 10 days please notify you surgeon.

## 2022-03-10 ENCOUNTER — Other Ambulatory Visit: Payer: Self-pay | Admitting: Physician Assistant

## 2022-03-10 MED ORDER — OXYCODONE-ACETAMINOPHEN 5-325 MG PO TABS: 1.0000 | ORAL_TABLET | Freq: Four times a day (QID) | ORAL | 0 refills | Status: DC | PRN

## 2022-03-10 MED ORDER — METHOCARBAMOL 500 MG PO TABS: 500.0000 mg | ORAL_TABLET | Freq: Two times a day (BID) | ORAL | 2 refills | Status: DC | PRN

## 2022-03-10 MED ORDER — DOCUSATE SODIUM 100 MG PO CAPS: 100.0000 mg | ORAL_CAPSULE | Freq: Every day | ORAL | 2 refills | Status: AC | PRN

## 2022-03-10 MED ORDER — ONDANSETRON HCL 4 MG PO TABS: 4.0000 mg | ORAL_TABLET | Freq: Three times a day (TID) | ORAL | 0 refills | Status: AC | PRN

## 2022-03-10 MED ORDER — ASPIRIN 81 MG PO TBEC: 81.0000 mg | DELAYED_RELEASE_TABLET | Freq: Two times a day (BID) | ORAL | 0 refills | Status: AC

## 2022-03-11 ENCOUNTER — Other Ambulatory Visit: Payer: Self-pay

## 2022-03-11 ENCOUNTER — Encounter (HOSPITAL_COMMUNITY): Payer: Self-pay

## 2022-03-11 ENCOUNTER — Encounter (HOSPITAL_COMMUNITY)
Admission: RE | Admit: 2022-03-11 | Discharge: 2022-03-11 | Disposition: A | Discharge status: Home / Self Care | Payer: Medicare PPO | Source: Ambulatory Visit | Attending: Orthopaedic Surgery | Admitting: Orthopaedic Surgery

## 2022-03-11 VITALS — BP 147/87 | HR 73 | Temp 98.5°F | Resp 17 | Ht 66.0 in | Wt 231.5 lb

## 2022-03-11 DIAGNOSIS — M1711 Unilateral primary osteoarthritis, right knee: Secondary | ICD-10-CM | POA: Diagnosis not present

## 2022-03-11 DIAGNOSIS — Z01818 Encounter for other preprocedural examination: Secondary | ICD-10-CM | POA: Insufficient documentation

## 2022-03-11 DIAGNOSIS — E119 Type 2 diabetes mellitus without complications: Secondary | ICD-10-CM | POA: Diagnosis not present

## 2022-03-11 LAB — BASIC METABOLIC PANEL
Anion gap: 9 (ref 5–15)
BUN: 17 mg/dL (ref 8–23)
CO2: 27 mmol/L (ref 22–32)
Calcium: 9.1 mg/dL (ref 8.9–10.3)
Chloride: 105 mmol/L (ref 98–111)
Creatinine, Ser: 0.79 mg/dL (ref 0.44–1.00)
GFR, Estimated: >60 mL/min (ref >60)
Glucose, Bld: 105 mg/dL — ABNORMAL HIGH (ref 70–99)
Potassium: 4 mmol/L (ref 3.5–5.1)
Sodium: 141 mmol/L (ref 135–145)

## 2022-03-11 LAB — SURGICAL PCR SCREEN
MRSA, PCR: NEGATIVE
Staphylococcus aureus: POSITIVE — AB

## 2022-03-11 LAB — CBC
HCT: 40.6 % (ref 36.0–46.0)
Hemoglobin: 12.9 g/dL (ref 12.0–15.0)
MCH: 27 pg (ref 26.0–34.0)
MCHC: 31.8 g/dL (ref 30.0–36.0)
MCV: 85.1 fL (ref 80.0–100.0)
Platelets: 295 10*3/uL (ref 150–400)
RBC: 4.77 MIL/uL (ref 3.87–5.11)
RDW: 14.4 % (ref 11.5–15.5)
WBC: 7.4 10*3/uL (ref 4.0–10.5)
nRBC: 0 % (ref 0.0–0.2)

## 2022-03-11 LAB — GLUCOSE, CAPILLARY: Glucose-Capillary: 115 mg/dL — ABNORMAL HIGH (ref 70–99)

## 2022-03-11 NOTE — Progress Notes (Signed)
PCP - Clelia Croft, NP Cardiologist - denies  PPM/ICD - n/a   Chest x-ray - n/a EKG - 03/11/22 Stress Test - denies ECHO - denies Cardiac Cath - denies  Sleep Study - denies CPAP - denies  Pt does not check CBG at home despite having the materials to do so.  Last A1C6.1 on 02/12/22.  Blood Thinner Instructions:  Aspirin Instructions:  ERAS Protcol -n/a PRE-SURGERY Ensure or G2- n/a  COVID TEST- n/a  Anesthesia review: Yes, ekg review.   Patient denies shortness of breath, fever, cough and chest pain at PAT appointment   All instructions explained to the patient, with a verbal understanding of the material. Patient agrees to go over the instructions while at home for a better understanding. Patient also instructed to self quarantine after being tested for COVID-19. The opportunity to ask questions was provided.

## 2022-03-11 NOTE — Progress Notes (Signed)
Prescription called in for Mupirocin for pt d/t Iodine allergy. 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 Pt aware of test results and filled prescription. Pt verbalized going to pick up prescription today and starting treatment asap.   Viviano Simas

## 2022-03-15 MED ORDER — TRANEXAMIC ACID 1000 MG/10ML IV SOLN
2000.0000 mg | INTRAVENOUS | Status: DC
  Filled 2022-03-15: qty 20

## 2022-03-17 DIAGNOSIS — M1711 Unilateral primary osteoarthritis, right knee: Secondary | ICD-10-CM

## 2022-03-18 ENCOUNTER — Other Ambulatory Visit: Payer: Self-pay

## 2022-03-18 ENCOUNTER — Ambulatory Visit (HOSPITAL_COMMUNITY): Payer: Medicare PPO

## 2022-03-18 ENCOUNTER — Encounter (HOSPITAL_COMMUNITY)
Admission: RE | Disposition: A | Discharge status: Home / Self Care | Payer: Self-pay | Source: Home / Self Care | Attending: Orthopaedic Surgery

## 2022-03-18 ENCOUNTER — Ambulatory Visit (HOSPITAL_COMMUNITY): Payer: Medicare PPO | Admitting: Physician Assistant

## 2022-03-18 ENCOUNTER — Ambulatory Visit (HOSPITAL_BASED_OUTPATIENT_CLINIC_OR_DEPARTMENT_OTHER): Payer: Medicare PPO | Admitting: Certified Registered Nurse Anesthetist

## 2022-03-18 ENCOUNTER — Observation Stay (HOSPITAL_COMMUNITY)
Admission: RE | Admit: 2022-03-18 | Discharge: 2022-03-19 | Disposition: A | Discharge status: Home / Self Care | Payer: Medicare PPO | Attending: Orthopaedic Surgery | Admitting: Orthopaedic Surgery

## 2022-03-18 ENCOUNTER — Encounter (HOSPITAL_COMMUNITY): Payer: Self-pay | Admitting: Orthopaedic Surgery

## 2022-03-18 DIAGNOSIS — Z96651 Presence of right artificial knee joint: Secondary | ICD-10-CM

## 2022-03-18 DIAGNOSIS — M1711 Unilateral primary osteoarthritis, right knee: Secondary | ICD-10-CM | POA: Diagnosis not present

## 2022-03-18 DIAGNOSIS — Z7982 Long term (current) use of aspirin: Secondary | ICD-10-CM | POA: Insufficient documentation

## 2022-03-18 DIAGNOSIS — M25561 Pain in right knee: Secondary | ICD-10-CM | POA: Insufficient documentation

## 2022-03-18 DIAGNOSIS — Z96643 Presence of artificial hip joint, bilateral: Secondary | ICD-10-CM | POA: Diagnosis not present

## 2022-03-18 DIAGNOSIS — Z79899 Other long term (current) drug therapy: Secondary | ICD-10-CM | POA: Diagnosis not present

## 2022-03-18 DIAGNOSIS — J45909 Unspecified asthma, uncomplicated: Secondary | ICD-10-CM | POA: Diagnosis not present

## 2022-03-18 DIAGNOSIS — E119 Type 2 diabetes mellitus without complications: Secondary | ICD-10-CM | POA: Diagnosis not present

## 2022-03-18 DIAGNOSIS — M1611 Unilateral primary osteoarthritis, right hip: Secondary | ICD-10-CM

## 2022-03-18 HISTORY — PX: TOTAL KNEE ARTHROPLASTY: SHX125

## 2022-03-18 LAB — GLUCOSE, CAPILLARY
Glucose-Capillary: 131 mg/dL — ABNORMAL HIGH (ref 70–99)
Glucose-Capillary: 78 mg/dL (ref 70–99)
Glucose-Capillary: 110 mg/dL — ABNORMAL HIGH (ref 70–99)
Glucose-Capillary: 103 mg/dL — ABNORMAL HIGH (ref 70–99)

## 2022-03-18 SURGERY — ARTHROPLASTY, KNEE, TOTAL
Anesthesia: Monitor Anesthesia Care | Site: Knee | Laterality: Right

## 2022-03-18 MED ORDER — ACETAMINOPHEN 500 MG PO TABS
1000.0000 mg | ORAL_TABLET | Freq: Four times a day (QID) | ORAL | Status: AC
  Administered 2022-03-18 – 2022-03-19 (×4): 1000 mg via ORAL
  Filled 2022-03-18 (×4): qty 2

## 2022-03-18 MED ORDER — OXYCODONE HCL 5 MG PO TABS
10.0000 mg | ORAL_TABLET | ORAL | Status: DC | PRN
  Administered 2022-03-19: 15 mg via ORAL
  Filled 2022-03-18: qty 3

## 2022-03-18 MED ORDER — DULAGLUTIDE 3 MG/0.5ML ~~LOC~~ SOAJ
3.0000 mg | SUBCUTANEOUS | Status: DC
Start: 2022-03-24 — End: 2022-03-18

## 2022-03-18 MED ORDER — DEXAMETHASONE SODIUM PHOSPHATE 10 MG/ML IJ SOLN
10.0000 mg | Freq: Once | INTRAMUSCULAR | Status: AC
Start: 2022-03-19 — End: 2022-03-19
  Administered 2022-03-19: 10 mg via INTRAVENOUS
  Filled 2022-03-18: qty 1

## 2022-03-18 MED ORDER — ONDANSETRON HCL 4 MG/2ML IJ SOLN
INTRAMUSCULAR | Status: DC | PRN
  Administered 2022-03-18: 4 mg via INTRAVENOUS

## 2022-03-18 MED ORDER — MENTHOL 3 MG MT LOZG: 1.0000 | LOZENGE | OROMUCOSAL | Status: DC | PRN

## 2022-03-18 MED ORDER — CHLORHEXIDINE GLUCONATE 0.12 % MT SOLN
15.0000 mL | Freq: Once | OROMUCOSAL | Status: AC
  Administered 2022-03-18: 15 mL via OROMUCOSAL
  Filled 2022-03-18: qty 15

## 2022-03-18 MED ORDER — LACTATED RINGERS IV SOLN: INTRAVENOUS | Status: DC

## 2022-03-18 MED ORDER — OXYCODONE HCL ER 10 MG PO T12A
10.0000 mg | EXTENDED_RELEASE_TABLET | Freq: Two times a day (BID) | ORAL | Status: DC
  Administered 2022-03-18 – 2022-03-19 (×3): 10 mg via ORAL
  Filled 2022-03-18 (×3): qty 1

## 2022-03-18 MED ORDER — TRANEXAMIC ACID-NACL 1000-0.7 MG/100ML-% IV SOLN
1000.0000 mg | Freq: Once | INTRAVENOUS | Status: AC
  Administered 2022-03-18: 1000 mg via INTRAVENOUS
  Filled 2022-03-18: qty 100

## 2022-03-18 MED ORDER — METHOCARBAMOL 500 MG PO TABS
500.0000 mg | ORAL_TABLET | Freq: Four times a day (QID) | ORAL | Status: DC | PRN
  Administered 2022-03-18 – 2022-03-19 (×4): 500 mg via ORAL
  Filled 2022-03-18 (×3): qty 1

## 2022-03-18 MED ORDER — FERROUS SULFATE 325 (65 FE) MG PO TABS
325.0000 mg | ORAL_TABLET | Freq: Three times a day (TID) | ORAL | Status: DC
Start: 2022-03-18 — End: 2022-03-19
  Administered 2022-03-18 – 2022-03-19 (×3): 325 mg via ORAL
  Filled 2022-03-18 (×3): qty 1

## 2022-03-18 MED ORDER — ONDANSETRON HCL 4 MG/2ML IJ SOLN
INTRAMUSCULAR | Status: AC
  Filled 2022-03-18: qty 2

## 2022-03-18 MED ORDER — SODIUM CHLORIDE 0.9 % IR SOLN
Status: DC | PRN
  Administered 2022-03-18: 1000 mL

## 2022-03-18 MED ORDER — BUPIVACAINE-MELOXICAM ER 400-12 MG/14ML IJ SOLN
INTRAMUSCULAR | Status: DC | PRN
  Administered 2022-03-18: 400 mg

## 2022-03-18 MED ORDER — BUPIVACAINE IN DEXTROSE 0.75-8.25 % IT SOLN
INTRATHECAL | Status: DC | PRN
  Administered 2022-03-18: 1.6 mL via INTRATHECAL

## 2022-03-18 MED ORDER — PHENYLEPHRINE HCL-NACL 20-0.9 MG/250ML-% IV SOLN
INTRAVENOUS | Status: DC | PRN
  Administered 2022-03-18: 20 ug/min via INTRAVENOUS

## 2022-03-18 MED ORDER — HYDROMORPHONE HCL 1 MG/ML IJ SOLN
INTRAMUSCULAR | Status: AC
  Filled 2022-03-18: qty 1

## 2022-03-18 MED ORDER — PRONTOSAN WOUND IRRIGATION OPTIME
TOPICAL | Status: DC | PRN
  Administered 2022-03-18: 1 via TOPICAL

## 2022-03-18 MED ORDER — PROPOFOL 500 MG/50ML IV EMUL
INTRAVENOUS | Status: DC | PRN
  Administered 2022-03-18: 75 ug/kg/min via INTRAVENOUS

## 2022-03-18 MED ORDER — ACETAMINOPHEN 325 MG PO TABS: 325.0000 mg | ORAL_TABLET | Freq: Four times a day (QID) | ORAL | Status: DC | PRN

## 2022-03-18 MED ORDER — 0.9 % SODIUM CHLORIDE (POUR BTL) OPTIME
TOPICAL | Status: DC | PRN
  Administered 2022-03-18: 1000 mL

## 2022-03-18 MED ORDER — METHOCARBAMOL 1000 MG/10ML IJ SOLN: 500.0000 mg | Freq: Four times a day (QID) | INTRAVENOUS | Status: DC | PRN

## 2022-03-18 MED ORDER — KETOROLAC TROMETHAMINE 15 MG/ML IJ SOLN
15.0000 mg | Freq: Four times a day (QID) | INTRAMUSCULAR | Status: AC
  Administered 2022-03-18 – 2022-03-19 (×4): 15 mg via INTRAVENOUS
  Filled 2022-03-18 (×4): qty 1

## 2022-03-18 MED ORDER — HYDROMORPHONE HCL 1 MG/ML IJ SOLN: 0.5000 mg | INTRAMUSCULAR | Status: DC | PRN

## 2022-03-18 MED ORDER — ASPIRIN 81 MG PO CHEW
81.0000 mg | CHEWABLE_TABLET | Freq: Two times a day (BID) | ORAL | Status: DC
  Administered 2022-03-18 – 2022-03-19 (×2): 81 mg via ORAL
  Filled 2022-03-18 (×2): qty 1

## 2022-03-18 MED ORDER — HYDROMORPHONE HCL 1 MG/ML IJ SOLN
0.5000 mg | INTRAMUSCULAR | Status: DC | PRN
  Administered 2022-03-18 (×2): 0.5 mg via INTRAVENOUS

## 2022-03-18 MED ORDER — MIDAZOLAM HCL 2 MG/2ML IJ SOLN
INTRAMUSCULAR | Status: AC
  Filled 2022-03-18: qty 2

## 2022-03-18 MED ORDER — OXYCODONE HCL 5 MG PO TABS
5.0000 mg | ORAL_TABLET | ORAL | Status: DC | PRN
  Administered 2022-03-18: 10 mg via ORAL
  Administered 2022-03-18: 5 mg via ORAL
  Administered 2022-03-19: 10 mg via ORAL
  Filled 2022-03-18 (×2): qty 2

## 2022-03-18 MED ORDER — MIDAZOLAM HCL 2 MG/2ML IJ SOLN: 1.0000 mg | Freq: Once | INTRAMUSCULAR | Status: AC

## 2022-03-18 MED ORDER — DOCUSATE SODIUM 100 MG PO CAPS
100.0000 mg | ORAL_CAPSULE | Freq: Two times a day (BID) | ORAL | Status: DC
  Administered 2022-03-18 – 2022-03-19 (×3): 100 mg via ORAL
  Filled 2022-03-18 (×3): qty 1

## 2022-03-18 MED ORDER — CLONIDINE HCL (ANALGESIA) 100 MCG/ML EP SOLN
EPIDURAL | Status: DC | PRN
  Administered 2022-03-18: 50 ug

## 2022-03-18 MED ORDER — MIDAZOLAM HCL 2 MG/2ML IJ SOLN
INTRAMUSCULAR | Status: AC
  Administered 2022-03-18: 1 mg via INTRAVENOUS
  Filled 2022-03-18: qty 2

## 2022-03-18 MED ORDER — METHOCARBAMOL 500 MG PO TABS
ORAL_TABLET | ORAL | Status: AC
  Filled 2022-03-18: qty 1

## 2022-03-18 MED ORDER — KETOROLAC TROMETHAMINE 30 MG/ML IJ SOLN
INTRAMUSCULAR | Status: DC | PRN
  Administered 2022-03-18: 30 mg via INTRAVENOUS

## 2022-03-18 MED ORDER — INSULIN ASPART 100 UNIT/ML IJ SOLN: 0.0000 [IU] | INTRAMUSCULAR | Status: DC | PRN

## 2022-03-18 MED ORDER — VANCOMYCIN HCL 1000 MG IV SOLR
INTRAVENOUS | Status: DC | PRN
  Administered 2022-03-18: 1000 mg

## 2022-03-18 MED ORDER — AMISULPRIDE (ANTIEMETIC) 5 MG/2ML IV SOLN
10.0000 mg | Freq: Once | INTRAVENOUS | Status: DC | PRN
Start: 2022-03-18 — End: 2022-03-18

## 2022-03-18 MED ORDER — FENTANYL CITRATE (PF) 100 MCG/2ML IJ SOLN
25.0000 ug | INTRAMUSCULAR | Status: DC | PRN
  Administered 2022-03-18: 50 ug via INTRAVENOUS

## 2022-03-18 MED ORDER — TRANEXAMIC ACID-NACL 1000-0.7 MG/100ML-% IV SOLN
1000.0000 mg | INTRAVENOUS | Status: AC
  Administered 2022-03-18: 1000 mg via INTRAVENOUS
  Filled 2022-03-18: qty 100

## 2022-03-18 MED ORDER — TRANEXAMIC ACID 1000 MG/10ML IV SOLN
INTRAVENOUS | Status: DC | PRN
  Administered 2022-03-18: 2000 mg via TOPICAL

## 2022-03-18 MED ORDER — PHENOL 1.4 % MT LIQD: 1.0000 | OROMUCOSAL | Status: DC | PRN

## 2022-03-18 MED ORDER — CEFAZOLIN SODIUM-DEXTROSE 2-4 GM/100ML-% IV SOLN
2.0000 g | Freq: Four times a day (QID) | INTRAVENOUS | Status: AC
  Administered 2022-03-18 (×2): 2 g via INTRAVENOUS
  Filled 2022-03-18 (×2): qty 100

## 2022-03-18 MED ORDER — OXYCODONE HCL 5 MG PO TABS
ORAL_TABLET | ORAL | Status: AC
  Filled 2022-03-18: qty 1

## 2022-03-18 MED ORDER — PROPOFOL 10 MG/ML IV BOLUS
INTRAVENOUS | Status: AC
  Filled 2022-03-18: qty 20

## 2022-03-18 MED ORDER — MUPIROCIN 2 % EX OINT: 1.0000 | TOPICAL_OINTMENT | Freq: Once | CUTANEOUS | Status: AC

## 2022-03-18 MED ORDER — FENTANYL CITRATE (PF) 100 MCG/2ML IJ SOLN: 50.0000 ug | Freq: Once | INTRAMUSCULAR | Status: AC

## 2022-03-18 MED ORDER — CEFAZOLIN SODIUM-DEXTROSE 2-4 GM/100ML-% IV SOLN
2.0000 g | INTRAVENOUS | Status: AC
  Administered 2022-03-18: 2 g via INTRAVENOUS
  Filled 2022-03-18: qty 100

## 2022-03-18 MED ORDER — ONDANSETRON HCL 4 MG/2ML IJ SOLN: 4.0000 mg | Freq: Four times a day (QID) | INTRAMUSCULAR | Status: DC | PRN

## 2022-03-18 MED ORDER — FENTANYL CITRATE (PF) 100 MCG/2ML IJ SOLN
INTRAMUSCULAR | Status: AC
  Administered 2022-03-18: 50 ug via INTRAVENOUS
  Filled 2022-03-18: qty 2

## 2022-03-18 MED ORDER — ONDANSETRON HCL 4 MG PO TABS: 4.0000 mg | ORAL_TABLET | Freq: Four times a day (QID) | ORAL | Status: DC | PRN

## 2022-03-18 MED ORDER — INSULIN ASPART 100 UNIT/ML IJ SOLN: 0.0000 [IU] | Freq: Every day | INTRAMUSCULAR | Status: DC

## 2022-03-18 MED ORDER — SODIUM CHLORIDE 0.9 % IV SOLN: INTRAVENOUS | Status: DC

## 2022-03-18 MED ORDER — METOCLOPRAMIDE HCL 5 MG PO TABS: 5.0000 mg | ORAL_TABLET | Freq: Three times a day (TID) | ORAL | Status: DC | PRN

## 2022-03-18 MED ORDER — PROPOFOL 10 MG/ML IV BOLUS
INTRAVENOUS | Status: DC | PRN
  Administered 2022-03-18: 30 mg via INTRAVENOUS
  Administered 2022-03-18: 10 mg via INTRAVENOUS

## 2022-03-18 MED ORDER — BUPIVACAINE-MELOXICAM ER 400-12 MG/14ML IJ SOLN
INTRAMUSCULAR | Status: AC
  Filled 2022-03-18: qty 1

## 2022-03-18 MED ORDER — KETOROLAC TROMETHAMINE 30 MG/ML IJ SOLN
INTRAMUSCULAR | Status: AC
  Filled 2022-03-18: qty 1

## 2022-03-18 MED ORDER — ROPIVACAINE HCL 5 MG/ML IJ SOLN
INTRAMUSCULAR | Status: DC | PRN
  Administered 2022-03-18: 20 mL via PERINEURAL

## 2022-03-18 MED ORDER — INSULIN ASPART 100 UNIT/ML IJ SOLN
0.0000 [IU] | Freq: Three times a day (TID) | INTRAMUSCULAR | Status: DC
  Administered 2022-03-19: 2 [IU] via SUBCUTANEOUS

## 2022-03-18 MED ORDER — ACETAMINOPHEN 500 MG PO TABS
1000.0000 mg | ORAL_TABLET | Freq: Once | ORAL | Status: AC
  Administered 2022-03-18: 1000 mg via ORAL
  Filled 2022-03-18: qty 2

## 2022-03-18 MED ORDER — ORAL CARE MOUTH RINSE: 15.0000 mL | Freq: Once | OROMUCOSAL | Status: AC

## 2022-03-18 MED ORDER — VANCOMYCIN HCL 1000 MG IV SOLR
INTRAVENOUS | Status: AC
  Filled 2022-03-18: qty 20

## 2022-03-18 MED ORDER — METOCLOPRAMIDE HCL 5 MG/ML IJ SOLN
5.0000 mg | Freq: Three times a day (TID) | INTRAMUSCULAR | Status: DC | PRN
  Administered 2022-03-18: 10 mg via INTRAVENOUS
  Filled 2022-03-18: qty 2

## 2022-03-18 MED ORDER — MUPIROCIN 2 % EX OINT
TOPICAL_OINTMENT | CUTANEOUS | Status: AC
  Administered 2022-03-18: 1 via TOPICAL
  Filled 2022-03-18: qty 22

## 2022-03-18 MED ORDER — FENTANYL CITRATE (PF) 250 MCG/5ML IJ SOLN
INTRAMUSCULAR | Status: AC
  Filled 2022-03-18: qty 5

## 2022-03-18 SURGICAL SUPPLY — 96 items
ADH SKN CLS APL DERMABOND .7 (GAUZE/BANDAGES/DRESSINGS) ×1
ALCOHOL 70% 16 OZ (MISCELLANEOUS) ×2 IMPLANT
APL PRP STRL LF DISP 70% ISPRP (MISCELLANEOUS) ×3
BAG COUNTER SPONGE SURGICOUNT (BAG) IMPLANT
BAG DECANTER FOR FLEXI CONT (MISCELLANEOUS) ×2 IMPLANT
BAG SPNG CNTER NS LX DISP (BAG)
BANDAGE ESMARK 6X9 LF (GAUZE/BANDAGES/DRESSINGS) IMPLANT
BLADE SAG 18X100X1.27 (BLADE) ×2 IMPLANT
BLADE SAW SGTL 13.0X1.19X90.0M (BLADE) ×1 IMPLANT
BNDG CMPR 9X6 STRL LF SNTH (GAUZE/BANDAGES/DRESSINGS)
BNDG ESMARK 6X9 LF (GAUZE/BANDAGES/DRESSINGS)
BOWL SMART MIX CTS (DISPOSABLE) ×2 IMPLANT
BSPLAT TIB 5D F CMNT STM RT (Knees) ×1 IMPLANT
CEMENT BONE REFOBACIN R1X40 US (Cement) ×2 IMPLANT
CHLORAPREP W/TINT 26 (MISCELLANEOUS) ×3 IMPLANT
CLSR STERI-STRIP ANTIMIC 1/2X4 (GAUZE/BANDAGES/DRESSINGS) ×3 IMPLANT
COMP FEM KNEE NRW SZ9 RT (Joint) ×2 IMPLANT
COMPONENT FEM KNEE NRW SZ9 RT (Joint) IMPLANT
COOLER ICEMAN CLASSIC (MISCELLANEOUS) ×2 IMPLANT
COVER SURGICAL LIGHT HANDLE (MISCELLANEOUS) ×2 IMPLANT
CUFF TOURN SGL QUICK 34 (TOURNIQUET CUFF) ×2
CUFF TOURN SGL QUICK 42 (TOURNIQUET CUFF) IMPLANT
CUFF TRNQT CYL 34X4.125X (TOURNIQUET CUFF) ×1 IMPLANT
DERMABOND ADVANCED (GAUZE/BANDAGES/DRESSINGS) ×1
DERMABOND ADVANCED .7 DNX12 (GAUZE/BANDAGES/DRESSINGS) ×1 IMPLANT
DRAPE EXTREMITY T 121X128X90 (DISPOSABLE) ×2 IMPLANT
DRAPE HALF SHEET 40X57 (DRAPES) ×2 IMPLANT
DRAPE INCISE 23X17 IOBAN STRL (DRAPES) ×1
DRAPE INCISE 23X17 STRL (DRAPES) IMPLANT
DRAPE INCISE IOBAN 23X17 STRL (DRAPES) ×1 IMPLANT
DRAPE INCISE IOBAN 66X45 STRL (DRAPES) ×2 IMPLANT
DRAPE ORTHO SPLIT 77X108 STRL (DRAPES) ×4
DRAPE POUCH INSTRU U-SHP 10X18 (DRAPES) ×2 IMPLANT
DRAPE SURG ORHT 6 SPLT 77X108 (DRAPES) ×2 IMPLANT
DRAPE U-SHAPE 47X51 STRL (DRAPES) ×4 IMPLANT
DRSG AQUACEL AG ADV 3.5X10 (GAUZE/BANDAGES/DRESSINGS) ×2 IMPLANT
DURAPREP 26ML APPLICATOR (WOUND CARE) ×3 IMPLANT
ELECT CAUTERY BLADE 6.4 (BLADE) ×2 IMPLANT
ELECT REM PT RETURN 9FT ADLT (ELECTROSURGICAL) ×2
ELECTRODE REM PT RTRN 9FT ADLT (ELECTROSURGICAL) ×1 IMPLANT
GLOVE BIOGEL PI IND STRL 7.0 (GLOVE) ×2 IMPLANT
GLOVE BIOGEL PI IND STRL 7.5 (GLOVE) ×5 IMPLANT
GLOVE BIOGEL PI INDICATOR 7.0 (GLOVE) ×2
GLOVE BIOGEL PI INDICATOR 7.5 (GLOVE)
GLOVE ECLIPSE 7.0 STRL STRAW (GLOVE) ×6 IMPLANT
GLOVE SKINSENSE NS SZ7.5 (GLOVE) ×2
GLOVE SKINSENSE STRL SZ7.5 (GLOVE) ×3 IMPLANT
GLOVE SURG SYN 7.5  E (GLOVE) ×4
GLOVE SURG SYN 7.5 E (GLOVE) ×2 IMPLANT
GLOVE SURG SYN 7.5 PF PI (GLOVE) ×2 IMPLANT
GLOVE SURG UNDER LTX SZ7.5 (GLOVE) ×4 IMPLANT
GLOVE SURG UNDER POLY LF SZ7 (GLOVE) ×4 IMPLANT
GOWN STRL REIN XL XLG (GOWN DISPOSABLE) ×2 IMPLANT
GOWN STRL REUS W/ TWL LRG LVL3 (GOWN DISPOSABLE) ×1 IMPLANT
GOWN STRL REUS W/TWL LRG LVL3 (GOWN DISPOSABLE) ×2
HANDPIECE INTERPULSE COAX TIP (DISPOSABLE) ×2
HDLS TROCR DRIL PIN KNEE 75 (PIN) ×2
HOOD PEEL AWAY FLYTE STAYCOOL (MISCELLANEOUS) ×4 IMPLANT
INSERT TIB ARTISURF SZ8-11X12 (Insert) ×1 IMPLANT
JET LAVAGE IRRISEPT WOUND (IRRIGATION / IRRIGATOR) ×2
KIT BASIN OR (CUSTOM PROCEDURE TRAY) ×2 IMPLANT
KIT TURNOVER KIT B (KITS) ×2 IMPLANT
LAVAGE JET IRRISEPT WOUND (IRRIGATION / IRRIGATOR) ×1 IMPLANT
MANIFOLD NEPTUNE II (INSTRUMENTS) ×2 IMPLANT
MARKER SKIN DUAL TIP RULER LAB (MISCELLANEOUS) ×4 IMPLANT
NDL SPNL 18GX3.5 QUINCKE PK (NEEDLE) ×1 IMPLANT
NEEDLE SPNL 18GX3.5 QUINCKE PK (NEEDLE) ×2 IMPLANT
NS IRRIG 1000ML POUR BTL (IV SOLUTION) ×2 IMPLANT
PACK TOTAL JOINT (CUSTOM PROCEDURE TRAY) ×2 IMPLANT
PAD ARMBOARD 7.5X6 YLW CONV (MISCELLANEOUS) ×4 IMPLANT
PAD COLD SHLDR WRAP-ON (PAD) ×2 IMPLANT
PIN DRILL HDLS TROCAR 75 4PK (PIN) IMPLANT
SAW OSC TIP CART 19.5X105X1.3 (SAW) ×2 IMPLANT
SCREW FEMALE HEX FIX 25X2.5 (ORTHOPEDIC DISPOSABLE SUPPLIES) ×1 IMPLANT
SET HNDPC FAN SPRY TIP SCT (DISPOSABLE) ×1 IMPLANT
STAPLER VISISTAT 35W (STAPLE) IMPLANT
STEM POLY PAT PLY 35M KNEE (Knees) ×1 IMPLANT
STEM TIBIA 5 DEG SZ F R KNEE (Knees) IMPLANT
SUCTION FRAZIER HANDLE 10FR (MISCELLANEOUS) ×2
SUCTION TUBE FRAZIER 10FR DISP (MISCELLANEOUS) ×1 IMPLANT
SUT ETHILON 2 0 FS 18 (SUTURE) ×3 IMPLANT
SUT MNCRL AB 3-0 PS2 27 (SUTURE) IMPLANT
SUT VIC AB 0 CT1 27 (SUTURE) ×4
SUT VIC AB 0 CT1 27XBRD ANBCTR (SUTURE) ×2 IMPLANT
SUT VIC AB 1 CTX 27 (SUTURE) ×5 IMPLANT
SUT VIC AB 1 CTX 36 (SUTURE) ×2
SUT VIC AB 1 CTX36XBRD ANBCTRL (SUTURE) IMPLANT
SUT VIC AB 2-0 CT1 27 (SUTURE) ×6
SUT VIC AB 2-0 CT1 TAPERPNT 27 (SUTURE) ×4 IMPLANT
SYR 50ML LL SCALE MARK (SYRINGE) ×4 IMPLANT
TIBIA STEM 5 DEG SZ F R KNEE (Knees) ×2 IMPLANT
TOWEL GREEN STERILE (TOWEL DISPOSABLE) ×2 IMPLANT
TOWEL GREEN STERILE FF (TOWEL DISPOSABLE) ×2 IMPLANT
TRAY CATH 16FR W/PLASTIC CATH (SET/KITS/TRAYS/PACK) IMPLANT
UNDERPAD 30X36 HEAVY ABSORB (UNDERPADS AND DIAPERS) ×2 IMPLANT
YANKAUER SUCT BULB TIP NO VENT (SUCTIONS) ×3 IMPLANT

## 2022-03-18 NOTE — Anesthesia Preprocedure Evaluation (Signed)
Anesthesia Evaluation  Patient identified by MRN, date of birth, ID band Patient awake    Reviewed: Allergy & Precautions, NPO status , Patient's Chart, lab work & pertinent test results  Airway Mallampati: II  TM Distance: >3 FB Neck ROM: Full    Dental  (+) Dental Advisory Given   Pulmonary asthma ,    breath sounds clear to auscultation       Cardiovascular negative cardio ROS   Rhythm:Regular Rate:Normal     Neuro/Psych negative neurological ROS     GI/Hepatic negative GI ROS, Neg liver ROS,   Endo/Other  diabetes, Type 2  Renal/GU negative Renal ROS     Musculoskeletal  (+) Arthritis ,   Abdominal   Peds  Hematology negative hematology ROS (+)   Anesthesia Other Findings   Reproductive/Obstetrics                             Lab Results  Component Value Date   WBC 7.4 03/11/2022   HGB 12.9 03/11/2022   HCT 40.6 03/11/2022   MCV 85.1 03/11/2022   PLT 295 03/11/2022   Lab Results  Component Value Date   CREATININE 0.79 03/11/2022   BUN 17 03/11/2022   NA 141 03/11/2022   K 4.0 03/11/2022   CL 105 03/11/2022   CO2 27 03/11/2022    Anesthesia Physical Anesthesia Plan  ASA: 2  Anesthesia Plan: Spinal and MAC   Post-op Pain Management: Regional block*, Tylenol PO (pre-op)* and Toradol IV (intra-op)*   Induction:   PONV Risk Score and Plan: 2 and Propofol infusion, Ondansetron and Treatment may vary due to age or medical condition  Airway Management Planned: Natural Airway and Simple Face Mask  Additional Equipment:   Intra-op Plan:   Post-operative Plan:   Informed Consent: I have reviewed the patients History and Physical, chart, labs and discussed the procedure including the risks, benefits and alternatives for the proposed anesthesia with the patient or authorized representative who has indicated his/her understanding and acceptance.       Plan Discussed  with: CRNA  Anesthesia Plan Comments:         Anesthesia Quick Evaluation

## 2022-03-18 NOTE — Anesthesia Procedure Notes (Signed)
Anesthesia Regional Block: Adductor canal block   Pre-Anesthetic Checklist: , timeout performed,  Correct Patient, Correct Site, Correct Laterality,  Correct Procedure, Correct Position, site marked,  Risks and benefits discussed,  Surgical consent,  Pre-op evaluation,  At surgeon's request and post-op pain management  Laterality: Right  Prep: chloraprep       Needles:  Injection technique: Single-shot  Needle Type: Echogenic Needle     Needle Length: 9cm  Needle Gauge: 21     Additional Needles:   Procedures:,,,, ultrasound used (permanent image in chart),,    Narrative:  Start time: 03/18/2022 7:58 AM End time: 03/18/2022 8:03 AM Injection made incrementally with aspirations every 5 mL.  Performed by: Personally  Anesthesiologist: Marcene Duos, MD

## 2022-03-18 NOTE — Evaluation (Signed)
Physical Therapy Evaluation Patient Details Name: Deborah Bolton MRN: [id number] DOB: Dec 29, 1960 Today's Date: 03/18/2022  History of Present Illness  The pt is a 61 yo female presenting 8/14 for R TKA due to R knee osteoarthritis. PMH includes: arthritis, asthma, depression, DM II, L hip arthroplasty, and R THA 10/08/21.   Clinical Impression  Pt in bed upon arrival of PT, agreeable to evaluation at this time. Prior to admission the pt was ambulating with use of cane or rollator to manage pain. The pt now presents with limitations in functional mobility, strength, power, stability, and endurance due to above dx, and will continue to benefit from skilled PT to address these deficits. The pt required minG to complete bed mobility and short bout of hallway ambulation, modA to complete initial sit-stand due to poor functional power in LLE and pain in RLE. The pt was limited by fatigue, will continue to benefit from skilled PT acutely and after d/c to maximize functional recovery and complete stair training prior to anticipated d/c. The pt was educated on knee precautions and given HEP, but too fatigued to go over or practice at this time.         Recommendations for follow up therapy are one component of a multi-disciplinary discharge planning process, led by the attending physician.  Recommendations may be updated based on patient status, additional functional criteria and insurance authorization.  Follow Up Recommendations Follow physician's recommendations for discharge plan and follow up therapies      Assistance Recommended at Discharge Frequent or constant Supervision/Assistance  Patient can return home with the following  A little help with walking and/or transfers;A little help with bathing/dressing/bathroom;Assistance with cooking/housework;Assistance with feeding;Direct supervision/assist for medications management;Direct supervision/assist for financial management;Assist for  transportation;Help with stairs or ramp for entrance    Equipment Recommendations None recommended by PT (pt has needed DME)  Recommendations for Other Services       Functional Status Assessment Patient has had a recent decline in their functional status and demonstrates the ability to make significant improvements in function in a reasonable and predictable amount of time.     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 cues for technique. pt reports limited by LLE weakness    Transfers Overall transfer level: Needs assistance Equipment used: Rolling walker (2 wheels) Transfers: Sit to/from Stand Sit to Stand: Mod assist           General transfer comment: modA initially given by spouse, pt able to return to sitting with minG    Ambulation/Gait Ambulation/Gait assistance: Min guard Gait Distance (Feet): 25 Feet Assistive device: Rolling walker (2 wheels) Gait Pattern/deviations: Step-through pattern, Shuffle, Decreased dorsiflexion - right, Decreased dorsiflexion - left Gait velocity: decreased Gait velocity interpretation: <1.31 ft/sec, indicative of household ambulator   General Gait Details: pt with slowed gait and minimal DF bilaterally but no instances of knee buckling. limited by fatigue     Balance Overall balance assessment: Mild deficits observed, not formally tested  Pertinent Vitals/Pain Pain Assessment Pain Assessment: Faces Faces Pain Scale: Hurts little  more Pain Location: bilateral LE Pain Descriptors / Indicators: Discomfort Pain Intervention(s): Limited activity within patient's tolerance, Monitored during session, Repositioned    Home Living Family/patient expects to be discharged to:: Private residence Living Arrangements: Spouse/significant other Available Help at Discharge: Family;Available 24 hours/day Type of Home: House Home Access: Level entry       Home Layout: One level (3 steps from den (where you enter house) to rest of living space, no rails) Home Equipment: Rolling Walker (2 wheels);Rollator (4 wheels);Grab bars - tub/shower;Tub bench;Cane - single point;BSC/3in1      Prior Function Prior Level of Function : Independent/Modified Independent             Mobility Comments: uses rollator for long distances, cane as needed in the home ADLs Comments: indep, drives, including IADLs     Hand Dominance   Dominant Hand: Right    Extremity/Trunk Assessment   Upper Extremity Assessment Upper Extremity Assessment: Overall WFL for tasks assessed    Lower Extremity Assessment Lower Extremity Assessment: RLE deficits/detail;LLE deficits/detail RLE Deficits / Details: grossly fucntional against gravity, pt denies sensation changes. 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, unable to fully DF, reports unable to extend knee for MMT, reports intermittent buckling, no buckling noted functionally at this time LLE: Unable to fully assess due to pain LLE Sensation: decreased light touch LLE Coordination: decreased fine motor;decreased gross motor    Cervical / Trunk Assessment Cervical / Trunk Assessment: Normal  Communication   Communication: No difficulties  Cognition Arousal/Alertness: Awake/alert Behavior During Therapy: Flat affect Overall Cognitive Status: Within Functional Limits for tasks assessed                                  General Comments: pt initially lethargic, but awakes with stimulation. cues for safety, but pt able to follow instructions and is generally oriented        General Comments General comments (skin integrity, edema, etc.): VSS on RA    Exercises     Assessment/Plan    PT Assessment Patient needs continued PT services  PT Problem List Decreased strength;Decreased range of motion;Decreased activity tolerance;Decreased balance;Decreased mobility       PT Treatment Interventions DME instruction;Gait training;Stair training;Functional mobility training;Therapeutic activities;Therapeutic exercise;Balance training;Patient/family education    PT Goals (Current goals can be found in the Care Plan section)  Acute Rehab PT Goals Patient Stated Goal: to rest, to return home PT Goal Formulation: With patient Time For Goal Achievement: 04/01/22 Potential to Achieve Goals: Good    Frequency 7X/week        AM-PAC PT "6 Clicks" Mobility  Outcome Measure Help needed turning from your back to your side while in a flat bed without using bedrails?: None Help needed moving from lying on your back to sitting on the side of a flat bed without using bedrails?: A Little Help needed moving to and from a bed to a chair (including a wheelchair)?: A Little Help needed standing up from a chair using your arms (e.g., wheelchair or bedside chair)?: A Little Help needed to walk in hospital room?: A Little Help needed climbing 3-5 steps with a railing? : A Little 6 Click Score: 19    End of Session Equipment Utilized During  Treatment: Gait belt Activity Tolerance: Patient limited by fatigue Patient left: in bed;with call bell/phone within reach;with family/visitor present Nurse Communication: Mobility status PT Visit Diagnosis: Other abnormalities of gait and mobility (R26.89);Muscle weakness (generalized) (M62.81);Difficulty in walking, not elsewhere classified  (R26.2)    Time: [id number] PT Time Calculation (min) (ACUTE ONLY): 21 min   Charges:   PT Evaluation $PT Eval Low Complexity: 1 Low          Vickki Muff, PT, DPT   Acute Rehabilitation Department  Ronnie Derby 03/18/2022, 3:53 PM

## 2022-03-18 NOTE — Anesthesia Procedure Notes (Signed)
Spinal  Patient location during procedure: OR Start time: 03/18/2022 8:47 AM End time: 03/18/2022 8:52 AM Reason for block: surgical anesthesia Staffing Performed: anesthesiologist  Anesthesiologist: Marcene Duos, MD Performed by: Marcene Duos, MD Authorized by: Marcene Duos, MD   Preanesthetic Checklist Completed: patient identified, IV checked, site marked, risks and benefits discussed, surgical consent, monitors and equipment checked, pre-op evaluation and timeout performed Spinal Block Patient position: sitting Prep: DuraPrep Patient monitoring: heart rate, cardiac monitor, continuous pulse ox and blood pressure Approach: midline Location: L4-5 Injection technique: single-shot Needle Needle type: Pencan  Needle gauge: 24 G Needle length: 9 cm Assessment Sensory level: T4 Events: CSF return

## 2022-03-18 NOTE — Anesthesia Procedure Notes (Signed)
Procedure Name: MAC Date/Time: 03/18/2022 9:04 AM  Performed by: Valda Favia, CRNAPre-anesthesia Checklist: Patient identified, Emergency Drugs available, Suction available, Patient being monitored and Timeout performed Patient Re-evaluated:Patient Re-evaluated prior to induction Oxygen Delivery Method: Simple face mask Preoxygenation: Pre-oxygenation with 100% oxygen Placement Confirmation: positive ETCO2 Dental Injury: Teeth and Oropharynx as per pre-operative assessment

## 2022-03-18 NOTE — Op Note (Signed)
Total Knee Arthroplasty Procedure Note  Preoperative diagnosis: Right knee osteoarthritis  Postoperative diagnosis:same  Operative findings: Complete loss of cartilage from all 3 compartments with osteophytosis  Operative procedure: Right total knee arthroplasty. CPT [phone]  Surgeon: N. Glee Arvin, MD  Assist: Oneal Grout, PA-C; necessary for the timely completion of procedure and due to complexity of procedure.  Anesthesia: Spinal, regional, local  Tourniquet time: see anesthesia record  Implants used: Zimmer persona Femur: CR 9 narrow Tibia: F Patella: 35 mm Polyethylene: 12 mm, MC  Indication: Deborah Bolton is a 61 y.o. year old female with a history of knee pain. Having failed conservative management, the patient elected to proceed with a total knee arthroplasty.  We have reviewed the risk and benefits of the surgery and they elected to proceed after voicing understanding.  Procedure:  After informed consent was obtained and understanding of the risk were voiced including but not limited to bleeding, infection, damage to surrounding structures including nerves and vessels, blood clots, leg length inequality and the failure to achieve desired results, the operative extremity was marked with verbal confirmation of the patient in the holding area.   The patient was then brought to the operating room and transported to the operating room table in the supine position.  A tourniquet was applied to the operative extremity around the upper thigh. The operative limb was then prepped and draped in the usual sterile fashion and preoperative antibiotics were administered.  A time out was performed prior to the start of surgery confirming the correct extremity, preoperative antibiotic administration, as well as team members, implants and instruments available for the case. Correct surgical site was also confirmed with preoperative radiographs. The limb was then elevated for  exsanguination and the tourniquet was inflated. A midline incision was made and a standard medial parapatellar approach was performed.  The infrapatellar fat pad was removed.  Suprapatellar synovium was removed to reveal the anterior distal femoral cortex.  A medial peel was performed to release the capsule of the medial tibial plateau.  The patella was then everted and was prepared and sized to a 35 mm.  A cover was placed on the patella for protection from retractors.  The knee was then brought into full flexion and we then turned our attention to the femur.  The cruciates were sacrificed.  Start site was drilled in the femur and the intramedullary distal femoral cutting guide was placed, set at 5 degrees valgus, taking 10 mm of distal resection. The distal cut was made. Osteophytes were then removed.  Next, the proximal tibial cutting guide was placed with appropriate slope, varus/valgus alignment and depth of resection. The proximal tibial cut was made taking 4 mm off the low side. Gap blocks were then used to assess the extension gap and alignment, and appropriate soft tissue releases were performed. Attention was turned back to the femur, which was sized using the sizing guide to a size 9 narrow. Appropriate rotation of the femoral component was determined using epicondylar axis, Whiteside's line, and assessing the flexion gap under ligament tension. The appropriate size 4-in-1 cutting block was placed and checked with an angel wing and cuts were made. Posterior femoral osteophytes and uncapped bone were then removed with the curved osteotome.  Trial components were placed, and stability was checked in full extension, mid-flexion, and deep flexion. Proper tibial rotation was determined and marked.  The patella tracked well without a lateral release.  The femoral lugs were then drilled. Trial  components were then removed and tibial preparation performed.  The tibia was sized for a size F component.   The  bony surfaces were irrigated with a pulse lavage and then dried. Bone cement was vacuum mixed on the back table, and the final components sized above were cemented into place.  Antibiotic irrigation was placed in the knee joint and soft tissues while the cement cured.  After cement had finished curing, excess cement was removed. The stability of the construct was re-evaluated throughout a range of motion and found to be acceptable. The trial liner was removed, the knee was copiously irrigated, and the knee was re-evaluated for any excess bone debris. The real polyethylene liner, 12 mm thick, was inserted and checked to ensure the locking mechanism had engaged appropriately. The tourniquet was deflated and hemostasis was achieved. The wound was irrigated with normal saline.  One gram of vancomycin powder was placed in the surgical bed.  Topical 0.25% bupivacaine and meloxicam was placed in the joint for postoperative pain.  Capsular closure was performed with a #1 vicryl, subcutaneous fat closed with a 0 vicryl suture, then subcutaneous tissue closed with interrupted 2.0 vicryl suture. The skin was then closed with a 2.0 nylon and dermabond. A sterile dressing was applied.  The patient was awakened in the operating room and taken to recovery in stable condition. All sponge, needle, and instrument counts were correct at the end of the case.  Tessa Lerner was necessary for opening, closing, retracting, limb positioning and overall facilitation and completion of the surgery.  Position: supine  Complications: none.  Time Out: performed   Drains/Packing: none  Estimated blood loss: minimal  Returned to Recovery Room: in good condition.   Antibiotics: yes   Mechanical VTE (DVT) Prophylaxis: sequential compression devices, TED thigh-high  Chemical VTE (DVT) Prophylaxis: aspirin  Fluid Replacement  Crystalloid: see anesthesia record Blood: none  FFP: none   Specimens Removed: 1 to pathology    Sponge and Instrument Count Correct? yes   PACU: portable radiograph - knee AP and Lateral   Plan/RTC: Return in 2 weeks for wound check.   Weight Bearing/Load Lower Extremity: full   Implant Name Type Inv. Item Serial No. Manufacturer Lot No. LRB No. Used Action  CEMENT BONE REFOBACIN [id number] [place] - [id number] Cement CEMENT BONE REFOBACIN [id number] [place]  ZIMMER RECON(ORTH,TRAU,BIO,SG) [id number] Right 2 Implanted  STEM POLY PAT PLY 76M KNEE - [id number] Knees STEM POLY PAT PLY 76M KNEE  ZIMMER RECON(ORTH,TRAU,BIO,SG) [id number] Right 1 Implanted  COMP FEM KNEE NRW SZ9 RT - [id number] Joint COMP FEM KNEE NRW SZ9 RT  ZIMMER RECON(ORTH,TRAU,BIO,SG) [id number] Right 1 Implanted  TIBIA STEM 5 DEG SZ F R KNEE - [id number] Knees TIBIA STEM 5 DEG SZ F R KNEE  ZIMMER RECON(ORTH,TRAU,BIO,SG) [id number] Right 1 Implanted  INSERT TIB ARTISURF [id number] - [id number] Insert INSERT TIB ARTISURF [id number]  ZIMMER RECON(ORTH,TRAU,BIO,SG) [id number] Right 1 Implanted    N. Glee Arvin, MD [facility] 10:32 AM

## 2022-03-18 NOTE — Anesthesia Postprocedure Evaluation (Signed)
Anesthesia Post Note  Patient: Deborah Bolton  Procedure(s) Performed: RIGHT TOTAL KNEE ARTHROPLASTY (Right: Knee)     Patient location during evaluation: PACU Anesthesia Type: Spinal Level of consciousness: awake and alert Pain management: pain level controlled Vital Signs Assessment: post-procedure vital signs reviewed and stable Respiratory status: spontaneous breathing and respiratory function stable Cardiovascular status: blood pressure returned to baseline and stable Postop Assessment: spinal receding Anesthetic complications: no   No notable events documented.  Last Vitals:  Vitals:   03/18/22 1229 03/18/22 1540  BP: (!) 147/79 105/60  Pulse: 61 64  Resp: 18 17  Temp: 36.5 C 36.6 C  SpO2: 95% 93%    Last Pain:  Vitals:   03/18/22 1540  TempSrc: Oral  PainSc:                  Kennieth Rad

## 2022-03-18 NOTE — Discharge Instructions (Signed)

## 2022-03-18 NOTE — Transfer of Care (Signed)
Immediate Anesthesia Transfer of Care Note  Patient: Deborah Bolton  Procedure(s) Performed: RIGHT TOTAL KNEE ARTHROPLASTY (Right: Knee)  Patient Location: PACU  Anesthesia Type:Spinal and MAC combined with regional for post-op pain  Level of Consciousness: awake, alert  and oriented  Airway & Oxygen Therapy: Patient Spontanous Breathing  Post-op Assessment: Report given to RN and Post -op Vital signs reviewed and stable  Post vital signs: Reviewed and stable  Last Vitals:  Vitals Value Taken Time  BP 122/78 03/18/22 1104  Temp    Pulse 64 03/18/22 1106  Resp 11 03/18/22 1106  SpO2 94 % 03/18/22 1106  Vitals shown include unvalidated device data.  Last Pain:  Vitals:   03/18/22 0702  TempSrc:   PainSc: 0-No pain         Complications: No notable events documented.

## 2022-03-18 NOTE — H&P (Signed)
PREOPERATIVE H&P  Chief Complaint: right knee primary localized osteoarthritis  HPI: Deborah Bolton is a 61 y.o. female who presents for surgical treatment of right knee primary localized osteoarthritis.  She denies any changes in medical history.  Past Medical History:  Diagnosis Date   Arthritis    Asthma    Complication of anesthesia    says she is "sensitive" to sedating medications   Depression    Diabetes mellitus without complication (HCC)    Dyspnea    History of kidney stones    Past Surgical History:  Procedure Laterality Date   ENDOVEIN HARVEST OF GREATER SAPHENOUS VEIN Left 2011   left leg   JOINT REPLACEMENT Left    hip arthroplasty   MANDIBLE SURGERY  1978   at age 33   TOTAL HIP ARTHROPLASTY Right 10/08/2021   Procedure: RIGHT TOTAL HIP ARTHROPLASTY ANTERIOR APPROACH, INCISIONAL VAC;  Surgeon: Tarry Kos, MD;  Location: [facility] OR;  Service: Orthopedics;  Laterality: Right;  C-3   TUBAL LIGATION     Social History   Socioeconomic History   Marital status: Married    Spouse name: Not on file   Number of children: Not on file   Years of education: Not on file   Highest education level: Not on file  Occupational History   Not on file  Tobacco Use   Smoking status: Never   Smokeless tobacco: Never  Vaping Use   Vaping Use: Never used  Substance and Sexual Activity   Alcohol use: Not Currently   Drug use: Not Currently   Sexual activity: Not on file  Other Topics Concern   Not on file  Social History Narrative   Not on file   Social Determinants of Health   Financial Resource Strain: Not on file  Food Insecurity: Not on file  Transportation Needs: Not on file  Physical Activity: Not on file  Stress: Not on file  Social Connections: Not on file   History reviewed. No pertinent family history. Allergies  Allergen Reactions   Iodine     Betadine blisters, chemical burns    Other     General anesthesia - takes a long time to wake up     Prior to Admission medications   Medication Sig Start Date End Date Taking? Authorizing Provider  albuterol (VENTOLIN HFA) 108 (90 Base) MCG/ACT inhaler Inhale 2 puffs into the lungs every 6 (six) hours as needed for wheezing or shortness of breath.   Yes [provider]  celecoxib (CELEBREX) 200 MG capsule Take 200 mg by mouth 2 (two) times daily. 02/21/22  Yes [provider]  FLUoxetine (PROZAC) 20 MG capsule Take 20 mg by mouth at bedtime.   Yes [provider]  fluticasone (FLONASE) 50 MCG/ACT nasal spray Place 1 spray into both nostrils 2 (two) times daily.   Yes [provider]  oxybutynin (DITROPAN XL) 15 MG 24 hr tablet Take 15 mg by mouth 2 (two) times daily.   Yes [provider]  TRULICITY 3 MG/0.5ML SOPN Inject 3 mg into the skin every Sunday. 02/28/22  Yes [provider]  albuterol (PROVENTIL) (2.5 MG/3ML) 0.083% nebulizer solution Take 2.5 mg by nebulization every 6 (six) hours as needed for wheezing or shortness of breath.    [provider]  aspirin EC 81 MG tablet Take 1 tablet (81 mg total) by mouth 2 (two) times daily. To be taken after surgery to prevent blood clots 03/10/22 03/10/23  Cristie Hem, PA-C  docusate sodium (COLACE) 100 MG capsule Take 1 capsule (100 mg total) by mouth daily as needed. 03/10/22 03/10/23  Cristie Hem, PA-C  methocarbamol (ROBAXIN) 500 MG tablet Take 1 tablet (500 mg total) by mouth 2 (two) times daily as needed. 03/10/22   Cristie Hem, PA-C  ondansetron (ZOFRAN) 4 MG tablet Take 1 tablet (4 mg total) by mouth every 8 (eight) hours as needed for nausea or vomiting. 03/10/22   Cristie Hem, PA-C  oxyCODONE-acetaminophen (PERCOCET) 5-325 MG tablet Take 1-2 tablets by mouth every 6 (six) hours as needed. To be taken after surgery 03/10/22   Cristie Hem, PA-C     Positive ROS: All other systems have been reviewed and were otherwise negative with the exception of those mentioned in  the HPI and as above.  Physical Exam: General: Alert, no acute distress Cardiovascular: No pedal edema Respiratory: No cyanosis, no use of accessory musculature GI: abdomen soft Skin: No lesions in the area of chief complaint Neurologic: Sensation intact distally Psychiatric: Patient is competent for consent with normal mood and affect Lymphatic: no lymphedema  MUSCULOSKELETAL: exam stable  Assessment: right knee primary localized osteoarthritis  Plan: Plan for Procedure(s): RIGHT TOTAL KNEE ARTHROPLASTY  The risks benefits and alternatives were discussed with the patient including but not limited to the risks of nonoperative treatment, versus surgical intervention including infection, bleeding, nerve injury,  blood clots, cardiopulmonary complications, morbidity, mortality, among others, and they were willing to proceed.   Glee Arvin, MD 03/18/2022 7:01 AM

## 2022-03-19 DIAGNOSIS — M1711 Unilateral primary osteoarthritis, right knee: Secondary | ICD-10-CM | POA: Diagnosis not present

## 2022-03-19 LAB — CBC
HCT: 36.2 % (ref 36.0–46.0)
Hemoglobin: 11.7 g/dL — ABNORMAL LOW (ref 12.0–15.0)
MCH: 27.7 pg (ref 26.0–34.0)
MCHC: 32.3 g/dL (ref 30.0–36.0)
MCV: 85.8 fL (ref 80.0–100.0)
Platelets: 266 10*3/uL (ref 150–400)
RBC: 4.22 MIL/uL (ref 3.87–5.11)
RDW: 14.2 % (ref 11.5–15.5)
WBC: 8.8 10*3/uL (ref 4.0–10.5)
nRBC: 0 % (ref 0.0–0.2)

## 2022-03-19 LAB — GLUCOSE, CAPILLARY: Glucose-Capillary: 124 mg/dL — ABNORMAL HIGH (ref 70–99)

## 2022-03-19 NOTE — Evaluation (Signed)
Occupational Therapy Evaluation Patient Details Name: Deborah Bolton MRN: [id number] DOB: 06/07/1961 Today's Date: 03/19/2022   History of Present Illness The pt is a 61 yo female presenting 8/14 for R TKA due to R knee osteoarthritis. PMH includes: arthritis, asthma, depression, DM II, L hip arthroplasty, and R THA 10/08/21.   Clinical Impression   PTA, pt lived with her husband and was independent in ADL, IADL, and driving. Upon eval, pt performing LB ADL and functional mobility with min guard A. Pt educated and demonstrating use of compensatory techniques for LB dressing, toilet transfer, and bed mobility. Pt educated regarding shower transfer, but reporting that she will not be getting in the shower for several days and will have her daughter present for first attempt at shower transfer. All education provided, all questions answered. Follow physicians recommendations for discharge plan. Acute OT to sign off.      Recommendations for follow up therapy are one component of a multi-disciplinary discharge planning process, led by the attending physician.  Recommendations may be updated based on patient status, additional functional criteria and insurance authorization.   Follow Up Recommendations  Follow physician's recommendations for discharge plan and follow up therapies    Assistance Recommended at Discharge Intermittent Supervision/Assistance  Patient can return home with the following A little help with walking and/or transfers;A little help with bathing/dressing/bathroom;Help with stairs or ramp for entrance;Assist for transportation;Assistance with cooking/housework    Functional Status Assessment  Patient has had a recent decline in their functional status and demonstrates the ability to make significant improvements in function in a reasonable and predictable amount of time.  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 No handout provided today, as PT provided yesterday Restrictions Weight Bearing Restrictions: Yes RLE Weight Bearing: Weight bearing as tolerated      Mobility Bed Mobility Overal bed mobility: Needs Assistance Bed Mobility: Supine to Sit, Sit to Supine     Supine to sit: Min guard Sit to supine: Min guard   General bed mobility comments: increased time. pt reports limited by LLE weakness    Transfers Overall transfer level: Needs assistance Equipment used: Rolling walker (2 wheels) Transfers: Sit to/from Stand Sit to Stand: Min guard           General transfer comment: Min guard A for all sit<>stand transfers      Balance Overall balance assessment: Mild deficits observed, not formally tested                                         ADL either performed or assessed with clinical judgement   ADL Overall ADL's : Needs assistance/impaired Eating/Feeding: Modified independent;Sitting   Grooming: Min guard;Standing   Upper Body Bathing: Set up;Sitting   Lower Body Bathing: Min guard;Sit to/from stand   Upper Body Dressing : Set up;Sitting   Lower Body Dressing: Min guard;Sit to/from stand Lower Body Dressing Details (indicate cue type and reason): Pt donning shoes with supervision. Toilet Transfer: Min guard;Ambulation;Rolling walker (2 wheels);BSC/3in1 Toilet Transfer Details (indicate cue type and reason): Toileting during session with min guard A. BSC over toilet Toileting- Clothing Manipulation and Hygiene:  Supervision/safety;Sitting/lateral lean Toileting - Clothing Manipulation Details (indicate cue type and reason): Performing during session   Tub/Shower Transfer Details (indicate cue type and reason): Pt reporting she will  sponge bathe for several days before performing transfer. Pt educated regarding shower transfer. Pt reporting she will perform with daughter present Functional mobility during ADLs: Min guard;Rolling walker (2 wheels) General ADL Comments: Pt with good problem so;ving throughout. Performing within precautions     Vision Baseline Vision/History: 0 No visual deficits Ability to See in Adequate Light: 0 Adequate Patient Visual Report: No change from baseline Vision Assessment?: No apparent visual deficits     Perception     Praxis      Pertinent Vitals/Pain Pain Assessment Pain Assessment: Faces Faces Pain Scale: Hurts little more Pain Location: bilateral LE Pain Descriptors / Indicators: Discomfort Pain Intervention(s): Limited activity within patient's tolerance, Repositioned     Hand Dominance Right   Extremity/Trunk Assessment Upper Extremity Assessment Upper Extremity Assessment: Overall WFL for tasks assessed   Lower Extremity Assessment Lower Extremity Assessment: Defer to PT evaluation   Cervical / Trunk Assessment Cervical / Trunk Assessment: Normal   Communication Communication Communication: No difficulties   Cognition Arousal/Alertness: Awake/alert Behavior During Therapy: WFL for tasks assessed/performed Overall Cognitive Status: Within Functional Limits for tasks assessed                                 General Comments: Pt following instructions throughout session and with good problem solving to bring RW into restroom     General Comments  VSS    Exercises     Shoulder Instructions      Home Living Family/patient expects to be discharged to:: Private residence Living Arrangements: Spouse/significant other Available Help at Discharge: Family;Available 24 hours/day Type of Home: House Home Access: Level entry     Home Layout: One level;Other (Comment) (3 steps from den (where you enter) to the rest of the living space)      Bathroom Shower/Tub: Chief Strategy Officer: Standard     Home Equipment: Agricultural consultant (2 wheels);Rollator (4 wheels);Grab bars - tub/shower;Tub bench;Cane - single point;BSC/3in1;Adaptive equipment Adaptive Equipment: Long-handled shoe horn;Reacher        Prior Functioning/Environment Prior Level of Function : Independent/Modified Independent             Mobility Comments: uses rollator for long distances, cane as needed in the home ADLs Comments: indep, drives, including IADLs        OT Problem List: Decreased strength;Decreased activity tolerance;Decreased range of motion;Impaired balance (sitting and/or standing);Decreased knowledge of use of DME or AE;Decreased knowledge of precautions;Pain      OT Treatment/Interventions: Self-care/ADL training;Therapeutic exercise;Therapeutic activities;Patient/family education;DME and/or AE instruction    OT Goals(Current goals can be found in the care plan section) Acute Rehab OT Goals Patient Stated Goal: Go home OT Goal Formulation: With patient  OT Frequency: Min 1X/week    Co-evaluation              AM-PAC OT "6 Clicks" Daily Activity     Outcome Measure Help from another person eating meals?: None Help from another person taking care of personal grooming?: A Little Help from another person toileting, which includes using toliet, bedpan, or urinal?: A Little Help from another person bathing (including washing, rinsing, drying)?: A Little Help from another person to put on and taking off regular upper body clothing?: A Little Help  from another person to put on and taking off regular lower body clothing?: A Little 6 Click Score: 19   End of Session Equipment Utilized During Treatment: Gait belt;Rolling walker (2 wheels) Nurse Communication: Mobility status  Activity Tolerance: Patient tolerated treatment well Patient left: in bed;with call bell/phone within reach  OT Visit Diagnosis: Unsteadiness on  feet (R26.81);Muscle weakness (generalized) (M62.81);Other abnormalities of gait and mobility (R26.89);Pain Pain - Right/Left: Right Pain - part of body: Knee                Time: 7425-9563 OT Time Calculation (min): 22 min Charges:  OT General Charges $OT Visit: 1 Visit OT Evaluation $OT Eval Low Complexity: 1 Low  Ladene Artist, OTR/L [facility] Acute Rehabilitation Office: [phone]   Drue Novel 03/19/2022, 10:48 AM

## 2022-03-19 NOTE — Plan of Care (Signed)
  Problem: Health Behavior/Discharge Planning: Goal: Ability to identify and utilize available resources and services will improve Outcome: Completed/Met Goal: Ability to manage health-related needs will improve Outcome: Completed/Met   Problem: Metabolic: Goal: Ability to maintain appropriate glucose levels will improve Outcome: Completed/Met   Problem: Nutritional: Goal: Maintenance of adequate nutrition will improve Outcome: Completed/Met Goal: Progress toward achieving an optimal weight will improve Outcome: Completed/Met   Problem: Skin Integrity: Goal: Risk for impaired skin integrity will decrease Outcome: Completed/Met   Problem: Tissue Perfusion: Goal: Adequacy of tissue perfusion will improve Outcome: Completed/Met   Problem: Education: Goal: Knowledge of the prescribed therapeutic regimen will improve Outcome: Completed/Met Goal: Individualized Educational Video(s) Outcome: Completed/Met   Problem: Activity: Goal: Ability to avoid complications of mobility impairment will improve Outcome: Completed/Met Goal: Range of joint motion will improve Outcome: Completed/Met   Problem: Clinical Measurements: Goal: Postoperative complications will be avoided or minimized Outcome: Completed/Met   Problem: Pain Management: Goal: Pain level will decrease with appropriate interventions Outcome: Completed/Met   Problem: Skin Integrity: Goal: Will show signs of wound healing Outcome: Completed/Met   Problem: Education: Goal: Knowledge of the prescribed therapeutic regimen will improve Outcome: Completed/Met Goal: Individualized Educational Video(s) Outcome: Completed/Met   Problem: Activity: Goal: Ability to avoid complications of mobility impairment will improve Outcome: Completed/Met Goal: Range of joint motion will improve Outcome: Completed/Met   Problem: Clinical Measurements: Goal: Postoperative complications will be avoided or minimized Outcome:  Completed/Met   Problem: Pain Management: Goal: Pain level will decrease with appropriate interventions Outcome: Completed/Met   Problem: Skin Integrity: Goal: Will show signs of wound healing Outcome: Completed/Met

## 2022-03-19 NOTE — Progress Notes (Signed)
Subjective: 1 Day Post-Op Procedure(s) (LRB): RIGHT TOTAL KNEE ARTHROPLASTY (Right) Patient reports pain as mild.    Objective: Vital signs in last 24 hours: Temp:  [97 F (36.1 C)-98.9 F (37.2 C)] 98.9 F (37.2 C) (08/15 0735) Pulse Rate:  [54-74] 74 (08/15 0735) Resp:  [11-23] 16 (08/15 0735) BP: (105-165)/(60-94) 128/75 (08/15 0735) SpO2:  [87 %-100 %] 94 % (08/15 0735)  Intake/Output from previous day: 08/14 0701 - 08/15 0700 In: 3060 [P.O.:960; I.V.:2000; IV Piggyback:100] Out: 1600 [Urine:1600] Intake/Output this shift: No intake/output data recorded.  Recent Labs    03/19/22 0648  HGB 11.7*   Recent Labs    03/19/22 0648  WBC 8.8  RBC 4.22  HCT 36.2  PLT 266   No results for input(s): "NA", "K", "CL", "CO2", "BUN", "CREATININE", "GLUCOSE", "CALCIUM" in the last 72 hours. No results for input(s): "LABPT", "INR" in the last 72 hours.  Neurologically intact Neurovascular intact Sensation intact distally Intact pulses distally Dorsiflexion/Plantar flexion intact Incision: scant drainage No cellulitis present Compartment soft   Assessment/Plan: 1 Day Post-Op Procedure(s) (LRB): RIGHT TOTAL KNEE ARTHROPLASTY (Right) Advance diet Up with therapy D/C IV fluids Discharge home with home health after first or second PT session as long as cleared by PT WBAT RLE   Anticipated LOS equal to or greater than 2 midnights due to - Age 62 and older with one or more of the following:  - Obesity  - Expected need for hospital services (PT, OT, Nursing) required for safe  discharge  - Anticipated need for postoperative skilled nursing care or inpatient rehab  - Active co-morbidities: None OR   - Unanticipated findings during/Post Surgery: None  - Patient is a high risk of re-admission due to: None   Cristie Hem 03/19/2022, 7:55 AM

## 2022-03-19 NOTE — Progress Notes (Signed)
Physical Therapy Treatment Patient Details Name: Deborah Bolton MRN: [id number] DOB: 05/01/1961 Today's Date: 03/19/2022   History of Present Illness The pt is a 61 yo female presenting 8/14 for R TKA due to R knee osteoarthritis. PMH includes: arthritis, asthma, depression, DM II, L hip arthroplasty, and R THA 10/08/21.    PT Comments    Pt progressing towards physical therapy goals. Was able to perform transfers and ambulation with up to min guard assist for safety and RW for support.  Session focused on HEP and stair training. Pt declining a second PT session, stating "I can do this myself at home." Limitations with LLE are baseline and pt demonstrating good control of RLE with exercises and no knee buckling throughout functional mobility. Feel pt is safe for d/c home from a PT perspective. Will continue to follow and progress as able per POC until d/c.    Recommendations for follow up therapy are one component of a multi-disciplinary discharge planning process, led by the attending physician.  Recommendations may be updated based on patient status, additional functional criteria and insurance authorization.  Follow Up Recommendations  Follow physician's recommendations for discharge plan and follow up therapies     Assistance Recommended at Discharge Frequent or constant Supervision/Assistance  Patient can return home with the following A little help with walking and/or transfers;A little help with bathing/dressing/bathroom;Assistance with cooking/housework;Assistance with feeding;Direct supervision/assist for medications management;Direct supervision/assist for financial management;Assist for transportation;Help with stairs or ramp for entrance   Equipment Recommendations  None recommended by PT    Recommendations for Other Services       Precautions / Restrictions Precautions Precautions: Fall;Knee Precaution Booklet Issued: Yes (comment) Precaution Comments: Pt was educated on  positioning RLE in extension with NO pillow/roll/ice pack under knee, and to try and boost up LE from the ankle to promote knee extension. Restrictions Weight Bearing Restrictions: Yes RLE Weight Bearing: Weight bearing as tolerated     Mobility  Bed Mobility Overal bed mobility: Modified Independent Bed Mobility: Supine to Sit, Sit to Supine           General bed mobility comments: Increased time but able to complete without assistance.    Transfers Overall transfer level: Needs assistance Equipment used: Rolling walker (2 wheels) Transfers: Sit to/from Stand Sit to Stand: Supervision           General transfer comment: Close supervision for safety, however pt was able to power up to full stand well with increased time.    Ambulation/Gait Ambulation/Gait assistance: Min guard Gait Distance (Feet): 175 Feet Assistive device: Rolling walker (2 wheels) Gait Pattern/deviations: Step-through pattern, Shuffle, Decreased dorsiflexion - right, Decreased dorsiflexion - left Gait velocity: decreased Gait velocity interpretation: <1.31 ft/sec, indicative of household ambulator   General Gait Details: Slow and effortful but generally steady with RW for support. Pt reports increased pain and difficulty advancing LE's due to LLE weakness (baseline). No overt LOB noted.   Stairs Stairs: Yes Stairs assistance: Min guard Stair Management: Two rails, Step to pattern, Forwards Number of Stairs: 3 General stair comments: VC's for sequencing and general safety. No assist required but close guard provided for safety. Pt reports having a walker that adjusts for the stairs at home and explained exactly how she typically enters and exits her home. As we do not have a walker for stairs available here, we practiced with the railings.   Wheelchair Mobility    Modified Rankin (Stroke Patients Only)  Balance Overall balance assessment: Mild deficits observed, not formally tested                                           Cognition Arousal/Alertness: Awake/alert Behavior During Therapy: WFL for tasks assessed/performed Overall Cognitive Status: Within Functional Limits for tasks assessed                                 General Comments: Pt following instructions throughout session and with good problem solving to bring RW into restroom        Exercises Total Joint Exercises Ankle Circles/Pumps: 10 reps Quad Sets: 10 reps Short Arc Quad: 10 reps Heel Slides: 10 reps Hip ABduction/ADduction: 10 reps Straight Leg Raises: 10 reps Goniometric ROM: ~90 supine in bed.    General Comments General comments (skin integrity, edema, etc.): VSS      Pertinent Vitals/Pain Pain Assessment Pain Assessment: Faces Faces Pain Scale: Hurts little more Pain Location: bilateral LE Pain Descriptors / Indicators: Discomfort Pain Intervention(s): Limited activity within patient's tolerance, Monitored during session, Repositioned    Home Living Family/patient expects to be discharged to:: Private residence Living Arrangements: Spouse/significant other Available Help at Discharge: Family;Available 24 hours/day Type of Home: House Home Access: Level entry Entrance Stairs-Rails: None Entrance Stairs-Number of Steps: 3   Home Layout: One level;Other (Comment) (3 steps from den (where you enter) to the rest of the living space) Home Equipment: Agricultural consultant (2 wheels);Rollator (4 wheels);Grab bars - tub/shower;Tub bench;Cane - single point;BSC/3in1;Adaptive equipment      Prior Function            PT Goals (current goals can now be found in the care plan section) Acute Rehab PT Goals Patient Stated Goal: to rest, to return home PT Goal Formulation: With patient Time For Goal Achievement: 04/01/22 Potential to Achieve Goals: Good Progress towards PT goals: Progressing toward goals    Frequency    7X/week      PT Plan Current  plan remains appropriate    Co-evaluation              AM-PAC PT "6 Clicks" Mobility   Outcome Measure  Help needed turning from your back to your side while in a flat bed without using bedrails?: None Help needed moving from lying on your back to sitting on the side of a flat bed without using bedrails?: None Help needed moving to and from a bed to a chair (including a wheelchair)?: A Little Help needed standing up from a chair using your arms (e.g., wheelchair or bedside chair)?: A Little Help needed to walk in hospital room?: A Little Help needed climbing 3-5 steps with a railing? : A Little 6 Click Score: 20    End of Session Equipment Utilized During Treatment: Gait belt Activity Tolerance: Patient limited by fatigue Patient left: in bed;with call bell/phone within reach;with family/visitor present Nurse Communication: Mobility status PT Visit Diagnosis: Other abnormalities of gait and mobility (R26.89);Muscle weakness (generalized) (M62.81);Difficulty in walking, not elsewhere classified (R26.2)     Time: 5732-2025 PT Time Calculation (min) (ACUTE ONLY): 33 min  Charges:  $Gait Training: 8-22 mins $Therapeutic Exercise: 8-22 mins                     Conni Slipper, PT, DPT Acute Rehabilitation  Services Secure Chat Preferred Office: [phone]    Deborah Bolton 03/19/2022, 11:05 AM

## 2022-03-19 NOTE — Discharge Summary (Signed)
Patient ID: Deborah Bolton MRN: [id number] DOB/AGE: Jul 25, 1961 61 y.o.  Admit date: 03/18/2022 Discharge date: 03/19/2022  Admission Diagnoses:  Principal Problem:   Primary osteoarthritis of right knee Active Problems:   Status post total right knee replacement   Discharge Diagnoses:  Same  Past Medical History:  Diagnosis Date   Arthritis    Asthma    Complication of anesthesia    says she is "sensitive" to sedating medications   Depression    Diabetes mellitus without complication (HCC)    Dyspnea    History of kidney stones     Surgeries: Procedure(s): RIGHT TOTAL KNEE ARTHROPLASTY on 03/18/2022   Consultants:   Discharged Condition: Improved  Hospital Course: Deborah Bolton is an 61 y.o. female who was admitted 03/18/2022 for operative treatment ofPrimary osteoarthritis of right knee. Patient has severe unremitting pain that affects sleep, daily activities, and work/hobbies. After pre-op clearance the patient was taken to the operating room on 03/18/2022 and underwent  Procedure(s): RIGHT TOTAL KNEE ARTHROPLASTY.    Patient was given perioperative antibiotics:  Anti-infectives (From admission, onward)    Start     Dose/Rate Route Frequency Ordered Stop   03/18/22 1500  ceFAZolin (ANCEF) IVPB 2g/100 mL premix        2 g 200 mL/hr over 30 Minutes Intravenous Every 6 hours 03/18/22 1207 03/18/22 2202   03/18/22 0928  vancomycin (VANCOCIN) powder  Status:  Discontinued          As needed 03/18/22 0928 03/18/22 1101   03/18/22 0645  ceFAZolin (ANCEF) IVPB 2g/100 mL premix        2 g 200 mL/hr over 30 Minutes Intravenous On call to O.R. 03/18/22 8099 03/18/22 0855        Patient was given sequential compression devices, early ambulation, and chemoprophylaxis to prevent DVT.  Patient benefited maximally from hospital stay and there were no complications.    Recent vital signs: Patient Vitals for the past 24 hrs:  BP Temp Temp src Pulse Resp SpO2  03/19/22 0735  128/75 98.9 F (37.2 C) Oral 74 16 94 %  03/19/22 0344 (!) 155/82 98.9 F (37.2 C) Oral 70 20 96 %  03/18/22 2332 127/60 98.3 F (36.8 C) Oral 74 20 93 %  03/18/22 2030 125/79 98.4 F (36.9 C) Oral 72 18 98 %  03/18/22 1540 105/60 97.8 F (36.6 C) Oral 64 17 93 %  03/18/22 1229 (!) 147/79 97.7 F (36.5 C) -- 61 18 95 %  03/18/22 1220 -- -- -- (!) 59 12 92 %  03/18/22 1215 138/78 97.8 F (36.6 C) -- 64 17 94 %  03/18/22 1200 -- 97.8 F (36.6 C) -- 66 (!) 23 94 %  03/18/22 1153 -- -- -- 65 15 94 %  03/18/22 1145 (!) 165/86 -- -- 62 14 99 %  03/18/22 1140 -- -- -- 61 15 96 %  03/18/22 1130 132/75 -- -- (!) 54 11 100 %  03/18/22 1115 116/67 -- -- (!) 59 12 (!) 87 %  03/18/22 1105 122/78 (!) 97 F (36.1 C) -- 64 13 93 %  03/18/22 0810 113/67 -- -- 70 13 92 %  03/18/22 0809 -- -- -- 69 11 92 %  03/18/22 0808 -- -- -- 69 11 92 %  03/18/22 0807 -- -- -- 71 17 91 %  03/18/22 0806 -- -- -- 71 11 91 %  03/18/22 0805 115/73 -- -- 73 12 91 %  03/18/22 0804 -- -- --  71 15 95 %  03/18/22 0803 -- -- -- 69 12 95 %  03/18/22 0802 -- -- -- 71 15 94 %  03/18/22 0801 -- -- -- 68 16 94 %  03/18/22 0800 (!) 132/94 -- -- 73 16 95 %     Recent laboratory studies:  Recent Labs    03/19/22 0648  WBC 8.8  HGB 11.7*  HCT 36.2  PLT 266     Discharge Medications:   Allergies as of 03/19/2022       Reactions   Iodine    Betadine blisters, chemical burns    Other    General anesthesia - takes a long time to wake up         Medication List     STOP taking these medications    celecoxib 200 MG capsule Commonly known as: CELEBREX       TAKE these medications    albuterol 108 (90 Base) MCG/ACT inhaler Commonly known as: VENTOLIN HFA Inhale 2 puffs into the lungs every 6 (six) hours as needed for wheezing or shortness of breath.   albuterol (2.5 MG/3ML) 0.083% nebulizer solution Commonly known as: PROVENTIL Take 2.5 mg by nebulization every 6 (six) hours as needed for wheezing  or shortness of breath.   aspirin EC 81 MG tablet Take 1 tablet (81 mg total) by mouth 2 (two) times daily. To be taken after surgery to prevent blood clots   docusate sodium 100 MG capsule Commonly known as: Colace Take 1 capsule (100 mg total) by mouth daily as needed.   FLUoxetine 20 MG capsule Commonly known as: PROZAC Take 20 mg by mouth at bedtime.   fluticasone 50 MCG/ACT nasal spray Commonly known as: FLONASE Place 1 spray into both nostrils 2 (two) times daily.   methocarbamol 500 MG tablet Commonly known as: Robaxin Take 1 tablet (500 mg total) by mouth 2 (two) times daily as needed.   ondansetron 4 MG tablet Commonly known as: Zofran Take 1 tablet (4 mg total) by mouth every 8 (eight) hours as needed for nausea or vomiting.   oxybutynin 15 MG 24 hr tablet Commonly known as: DITROPAN XL Take 15 mg by mouth 2 (two) times daily.   oxyCODONE-acetaminophen 5-325 MG tablet Commonly known as: Percocet Take 1-2 tablets by mouth every 6 (six) hours as needed. To be taken after surgery   Trulicity 3 MG/0.5ML Sopn Generic drug: Dulaglutide Inject 3 mg into the skin every Sunday.               Durable Medical Equipment  (From admission, onward)           Start     Ordered   03/18/22 1254  DME Walker rolling  Once       Question Answer Comment  Walker: With 5 Inch Wheels   Patient needs a walker to treat with the following condition Status post left partial knee replacement      03/18/22 1253   03/18/22 1254  DME 3 n 1  Once        08 /14/23 1253   03/18/22 1254  DME Bedside commode  Once       Question:  Patient needs a bedside commode to treat with the following condition  Answer:  Status post left partial knee replacement   03/18/22 1253            Diagnostic Studies: DG Knee Right Port  Result Date: 03/18/2022 CLINICAL DATA:  Status post right total  knee arthroplasty. EXAM: PORTABLE RIGHT KNEE - 1-2 VIEW COMPARISON:  02/11/2022 FINDINGS: There  are postoperative changes identified from interval right total knee arthroplasty. The hardware components are in anatomic alignment. No periprosthetic fracture or dislocation. Gas noted within the joint space and anterior soft tissues compatible with postoperative change. Loose bodies are again noted posterior to the knee. IMPRESSION: Status post right total knee arthroplasty. Electronically Signed   By: Signa Kell M.D.   On: 03/18/2022 11:58    Disposition: Discharge disposition: 01-Home or Self Care          Follow-up Information     [facility] Follow up.   Specialty: Home Health Services Why: The home health agency will contact you for the first home visit Contact information: [address] [place] [address] [phone]         Tarry Kos, MD. Schedule an appointment as soon as possible for a visit in 2 week(s).   Specialty: Orthopedic Surgery Contact information: [address] [place] [address] [phone]                  Signed: Cristie Hem 03/19/2022, 7:56 AM

## 2022-03-20 ENCOUNTER — Encounter (HOSPITAL_COMMUNITY): Payer: Self-pay | Admitting: Orthopaedic Surgery

## 2022-03-21 ENCOUNTER — Encounter: Payer: Medicare PPO | Admitting: Physician Assistant

## 2022-03-22 ENCOUNTER — Telehealth: Payer: Self-pay

## 2022-03-22 ENCOUNTER — Ambulatory Visit (INDEPENDENT_AMBULATORY_CARE_PROVIDER_SITE_OTHER): Payer: Medicare PPO | Admitting: Physician Assistant

## 2022-03-22 ENCOUNTER — Encounter: Payer: Self-pay | Admitting: Physician Assistant

## 2022-03-22 DIAGNOSIS — M1711 Unilateral primary osteoarthritis, right knee: Secondary | ICD-10-CM

## 2022-03-22 MED ORDER — OXYCODONE-ACETAMINOPHEN 10-325 MG PO TABS: 1.0000 | ORAL_TABLET | Freq: Four times a day (QID) | ORAL | 0 refills | Status: DC | PRN

## 2022-03-22 NOTE — Telephone Encounter (Signed)
error 

## 2022-03-22 NOTE — Telephone Encounter (Signed)
Patient called triage. She had a right total knee arthroplasty on 03/18/2022. She said that she is having "intense pain". She cannot sleep. She is taking her pain medicine, using the ice machine, and using her CPM. She wants to know what she do additionally to help with her pain.  Call back #[phone].  Sending to you since Dr.Xu and Mardella Layman are out.

## 2022-03-22 NOTE — Progress Notes (Addendum)
Office Visit Note   Patient: Deborah Bolton           Date of Birth: 17-Apr-1961           MRN: [id number] Visit Date: 03/22/2022              Requested by: Hortencia Conradi, NP [address] [place],  [place] [address] PCP: Hortencia Conradi, NP  Chief Complaint  Patient presents with   Right Knee - Routine Post Op      HPI: Patient is a pleasant 61 year old woman who is 4 days status post right total knee arthroplasty with Dr. Roda Shutters.  She comes in today complaining of pain focused in the area of her knee.  She denies any injuries.  She denies any fever chills or calf pain.  She is not getting adequate pain control with 5 mg Percocet and the Robaxin.  Assessment & Plan: Visit Diagnoses: right knee pain  Plan: Have increased her Percocet to 10 mg.  She understands not to take this with the leftover Percocet she still has.  I do not see any sign of infection I do not see any sign of a DVT  Follow-Up Instructions: No follow-ups on file.   Ortho Exam  Patient is alert, oriented, no adenopathy, well-dressed, normal affect, normal respiratory effort. She has well apposed wound edges well-healing surgical incision surgical sutures are in place without any erythema or cellulitis.  She has a negative Homans' sign and her calf is soft and nontender.  She has good dorsiflexion plantarflexion eversion inversion of her ankle.  Swelling is well controlled  Imaging: No results found. No images are attached to the encounter.  Labs: Lab Results  Component Value Date   HGBA1C 6.1 (H) 02/11/2022   HGBA1C 5.9 (H) 10/04/2021     Lab Results  Component Value Date   ALBUMIN 3.7 10/04/2021    No results found for: "MG" No results found for: "VD25OH"  No results found for: "PREALBUMIN"    Latest Ref Rng & Units 03/19/2022    6:48 AM 03/11/2022   10:34 AM 10/09/2021    5:59 AM  CBC EXTENDED  WBC 4.0 - 10.5 K/uL 8.8  7.4  16.6   RBC 3.87 - 5.11 MIL/uL 4.22  4.77  4.02   Hemoglobin 12.0 -  15.0 g/dL 95.1  88.4  16.6   HCT 36.0 - 46.0 % 36.2  40.6  34.2   Platelets 150 - 400 K/uL 266  295  262      There is no height or weight on file to calculate BMI.  Orders:  No orders of the defined types were placed in this encounter.  Meds ordered this encounter  Medications   oxyCODONE-acetaminophen (PERCOCET) 10-325 MG tablet    Sig: Take 1 tablet by mouth every 6 (six) hours as needed for pain.    Dispense:  30 tablet    Refill:  0     Procedures: No procedures performed  Clinical Data: No additional findings.  ROS:  All other systems negative, except as noted in the HPI. Review of Systems  Objective: Vital Signs: There were no vitals taken for this visit.  Specialty Comments:  No specialty comments available.  PMFS History: Patient Active Problem List   Diagnosis Date Noted   Status post total right knee replacement 03/18/2022   Primary osteoarthritis of right knee 03/17/2022   Status post total replacement of right hip 10/08/2021   ALLERGIC RHINITIS 01/12/2008  ASTHMA 01/12/2008   HEADACHE, CHRONIC 01/12/2008   Past Medical History:  Diagnosis Date   Arthritis    Asthma    Complication of anesthesia    says she is "sensitive" to sedating medications   Depression    Diabetes mellitus without complication (HCC)    Dyspnea    History of kidney stones     History reviewed. No pertinent family history.  Past Surgical History:  Procedure Laterality Date   ENDOVEIN HARVEST OF GREATER SAPHENOUS VEIN Left 2011   left leg   JOINT REPLACEMENT Left    hip arthroplasty   MANDIBLE SURGERY  1978   at age 11   TOTAL HIP ARTHROPLASTY Right 10/08/2021   Procedure: RIGHT TOTAL HIP ARTHROPLASTY ANTERIOR APPROACH, INCISIONAL VAC;  Surgeon: Tarry Kos, MD;  Location: [facility];  Service: Orthopedics;  Laterality: Right;  C-3   TOTAL KNEE ARTHROPLASTY Right 03/18/2022   Procedure: RIGHT TOTAL KNEE ARTHROPLASTY;  Surgeon: Tarry Kos, MD;  Location: [facility];   Service: Orthopedics;  Laterality: Right;   TUBAL LIGATION     Social History   Occupational History   Not on file  Tobacco Use   Smoking status: Never   Smokeless tobacco: Never  Vaping Use   Vaping Use: Never used  Substance and Sexual Activity   Alcohol use: Not Currently   Drug use: Not Currently   Sexual activity: Not on file

## 2022-04-01 ENCOUNTER — Other Ambulatory Visit: Payer: Self-pay | Admitting: Physician Assistant

## 2022-04-01 ENCOUNTER — Telehealth: Payer: Self-pay | Admitting: Orthopaedic Surgery

## 2022-04-01 MED ORDER — OXYCODONE-ACETAMINOPHEN 10-325 MG PO TABS: 1.0000 | ORAL_TABLET | Freq: Four times a day (QID) | ORAL | 0 refills | Status: DC | PRN

## 2022-04-01 NOTE — Telephone Encounter (Signed)
sent 

## 2022-04-01 NOTE — Telephone Encounter (Signed)
Pt called and states that she needs a refill on her pain medication.   Cb [phone]

## 2022-04-02 ENCOUNTER — Ambulatory Visit (INDEPENDENT_AMBULATORY_CARE_PROVIDER_SITE_OTHER): Payer: Medicare PPO | Admitting: Orthopaedic Surgery

## 2022-04-02 DIAGNOSIS — Z96651 Presence of right artificial knee joint: Secondary | ICD-10-CM

## 2022-04-02 NOTE — Progress Notes (Signed)
   Post-Op Visit Note   Patient: Deborah Bolton           Date of Birth: [birth year]           MRN: [id number] Visit Date: 04/02/2022 PCP: Hortencia Conradi, NP   Assessment & Plan:  Chief Complaint:  Chief Complaint  Patient presents with   Right Knee - Routine Post Op   Visit Diagnoses:  1. Status post total right knee replacement     Plan: [name] is 62 weeks status post right total knee replacement.  Using the CPM 4 hours a day.  She is making slow progress with home health PT.  Examination of right knee shows a healed surgical incision.  No signs of infection or drainage.  Expected postoperative swelling.  Range of motion is acceptable.  Sutures removed Steri-Strips applied.  Wound care instructions reviewed.  We will have her continue to do home health PT to work on strengthening and balance.  Continue CPM.  Continue aspirin for DVT prophylaxis.  Dental prophylaxis reinforced.  Recheck in 4 weeks with two-view x-rays of the right knee.  Follow-Up Instructions: Return in about 4 weeks (around 04/30/2022).   Orders:  No orders of the defined types were placed in this encounter.  No orders of the defined types were placed in this encounter.   Imaging: No results found.  PMFS History: Patient Active Problem List   Diagnosis Date Noted   Status post total right knee replacement 03/18/2022   Primary osteoarthritis of right knee 03/17/2022   Status post total replacement of right hip 10/08/2021   ALLERGIC RHINITIS 01/12/2008   ASTHMA 01/12/2008   HEADACHE, CHRONIC 01/12/2008   Past Medical History:  Diagnosis Date   Arthritis    Asthma    Complication of anesthesia    says she is "sensitive" to sedating medications   Depression    Diabetes mellitus without complication (HCC)    Dyspnea    History of kidney stones     No family history on file.  Past Surgical History:  Procedure Laterality Date   ENDOVEIN HARVEST OF GREATER SAPHENOUS VEIN Left 2011   left leg    JOINT REPLACEMENT Left    hip arthroplasty   MANDIBLE SURGERY  1978   at age 66   TOTAL HIP ARTHROPLASTY Right 10/08/2021   Procedure: RIGHT TOTAL HIP ARTHROPLASTY ANTERIOR APPROACH, INCISIONAL VAC;  Surgeon: Tarry Kos, MD;  Location: [facility];  Service: Orthopedics;  Laterality: Right;  C-3   TOTAL KNEE ARTHROPLASTY Right 03/18/2022   Procedure: RIGHT TOTAL KNEE ARTHROPLASTY;  Surgeon: Tarry Kos, MD;  Location: [facility];  Service: Orthopedics;  Laterality: Right;   TUBAL LIGATION     Social History   Occupational History   Not on file  Tobacco Use   Smoking status: Never   Smokeless tobacco: Never  Vaping Use   Vaping Use: Never used  Substance and Sexual Activity   Alcohol use: Not Currently   Drug use: Not Currently   Sexual activity: Not on file

## 2022-04-03 ENCOUNTER — Telehealth: Payer: Self-pay

## 2022-04-03 NOTE — Telephone Encounter (Signed)
Have her take it easy rest of the day and take NSAIDs and use ice.  Try some gentle ROM if it feels better.  If not, can come back to office for recheck.  Thanks.

## 2022-04-03 NOTE — Telephone Encounter (Signed)
Called and relayed information. She is going to call me tomorrow with an update.

## 2022-04-03 NOTE — Telephone Encounter (Signed)
Patient called stating that her right knee popped on Tuesday, 04/02/2022 and now she is not able to bend her knee today, using a walker, and used ice.  Stated that the swelling is still there, but she was moving her right knee better before it popped.  Would like to know if this is normal? Had right knee surgery on 03/18/2022.  CB# [phone].  Please advise.  Thank you.

## 2022-04-09 ENCOUNTER — Telehealth: Payer: Self-pay | Admitting: Orthopaedic Surgery

## 2022-04-09 ENCOUNTER — Other Ambulatory Visit: Payer: Self-pay | Admitting: Physician Assistant

## 2022-04-09 MED ORDER — OXYCODONE-ACETAMINOPHEN 5-325 MG PO TABS: 1.0000 | ORAL_TABLET | Freq: Three times a day (TID) | ORAL | 0 refills | Status: DC | PRN

## 2022-04-09 NOTE — Telephone Encounter (Signed)
Ok, great.  Thanks for checking!

## 2022-04-09 NOTE — Telephone Encounter (Signed)
Patient called needing Rx refilled Oxycodone. Patient said she called into the answering service and no one called her back. Patient said she had a fever last night. The number to contact pat [phone]

## 2022-04-09 NOTE — Telephone Encounter (Signed)
Sent in percocet.  Can you ask about her fever? See how high it has been. Make sure she has no worsening pain or any redness drainage to the knee?  Also see if she is constipated as this can sometimes cause slight increase in temp

## 2022-04-24 ENCOUNTER — Encounter: Payer: Self-pay | Admitting: Orthopaedic Surgery

## 2022-04-25 ENCOUNTER — Other Ambulatory Visit: Payer: Self-pay | Admitting: Orthopaedic Surgery

## 2022-04-25 MED ORDER — CEFADROXIL 500 MG PO CAPS: 500.0000 mg | ORAL_CAPSULE | Freq: Two times a day (BID) | ORAL | 0 refills | Status: DC

## 2022-04-30 ENCOUNTER — Encounter: Payer: Self-pay | Admitting: Orthopaedic Surgery

## 2022-04-30 ENCOUNTER — Ambulatory Visit (INDEPENDENT_AMBULATORY_CARE_PROVIDER_SITE_OTHER): Payer: Medicare PPO

## 2022-04-30 ENCOUNTER — Ambulatory Visit (INDEPENDENT_AMBULATORY_CARE_PROVIDER_SITE_OTHER): Payer: Medicare PPO | Admitting: Orthopaedic Surgery

## 2022-04-30 DIAGNOSIS — Z96651 Presence of right artificial knee joint: Secondary | ICD-10-CM | POA: Diagnosis not present

## 2022-04-30 NOTE — Progress Notes (Signed)
   Post-Op Visit Note   Patient: Deborah Bolton           Date of Birth: [birth year]           MRN: [id number] Visit Date: 04/30/2022 PCP: Zoila Shutter, NP   Assessment & Plan:  Chief Complaint:  Chief Complaint  Patient presents with   Right Knee - Follow-up    Right total knee arthroplasty 03/18/2022   Visit Diagnoses:  1. Status post total right knee replacement     Plan: Deborah Bolton is 6 weeks status post right total knee replacement 03/18/2022.  She is doing well overall.  Not having any issues other than a suture abscess that has resolved with some oral antibiotics.  She has achieved greater than 120 degrees of flexion with home health PT.  Ambulating with a cane.  Examination of right knee shows a resolving suture abscess.  Surgical scar otherwise is fully healed.  Excellent range of motion.  No varus valgus laxity.  X-rays show stable implant.  Dental prophylaxis reinforced.  Recheck in 6 weeks.  Questions encouraged and answered.  Follow-Up Instructions: Return in about 6 weeks (around 06/11/2022).   Orders:  Orders Placed This Encounter  Procedures   XR Knee 1-2 Views Right   No orders of the defined types were placed in this encounter.   Imaging: XR Knee 1-2 Views Right  Result Date: 04/30/2022 Stable total knee replacement in good alignment    PMFS History: Patient Active Problem List   Diagnosis Date Noted   Status post total right knee replacement 03/18/2022   Primary osteoarthritis of right knee 03/17/2022   Status post total replacement of right hip 10/08/2021   ALLERGIC RHINITIS 01/12/2008   ASTHMA 01/12/2008   HEADACHE, CHRONIC 01/12/2008   Past Medical History:  Diagnosis Date   Arthritis    Asthma    Complication of anesthesia    says she is "sensitive" to sedating medications   Depression    Diabetes mellitus without complication ([place])    Dyspnea    History of kidney stones     No family history on file.  Past Surgical History:  Procedure  Laterality Date   ENDOVEIN HARVEST OF GREATER SAPHENOUS VEIN Left 2011   left leg   JOINT REPLACEMENT Left    hip arthroplasty   MANDIBLE SURGERY  1978   at age 89   TOTAL HIP ARTHROPLASTY Right 10/08/2021   Procedure: RIGHT TOTAL HIP ARTHROPLASTY ANTERIOR APPROACH, INCISIONAL VAC;  Surgeon: Leandrew Koyanagi, MD;  Location: [facility];  Service: Orthopedics;  Laterality: Right;  C-3   TOTAL KNEE ARTHROPLASTY Right 03/18/2022   Procedure: RIGHT TOTAL KNEE ARTHROPLASTY;  Surgeon: Leandrew Koyanagi, MD;  Location: [facility];  Service: Orthopedics;  Laterality: Right;   TUBAL LIGATION     Social History   Occupational History   Not on file  Tobacco Use   Smoking status: Never   Smokeless tobacco: Never  Vaping Use   Vaping Use: Never used  Substance and Sexual Activity   Alcohol use: Not Currently   Drug use: Not Currently   Sexual activity: Not on file

## 2022-06-11 ENCOUNTER — Encounter: Payer: Self-pay | Admitting: Orthopaedic Surgery

## 2022-06-11 ENCOUNTER — Ambulatory Visit (INDEPENDENT_AMBULATORY_CARE_PROVIDER_SITE_OTHER): Payer: Medicare PPO | Admitting: Orthopaedic Surgery

## 2022-06-11 DIAGNOSIS — Z96651 Presence of right artificial knee joint: Secondary | ICD-10-CM

## 2022-06-11 MED ORDER — AMOXICILLIN 500 MG PO CAPS: 2000.0000 mg | ORAL_CAPSULE | Freq: Once | ORAL | 6 refills | Status: AC

## 2022-06-11 NOTE — Progress Notes (Signed)
   Post-Op Visit Note   Patient: Deborah Bolton           Date of Birth: 02-Jan-1961           MRN: [id number] Visit Date: 06/11/2022 PCP: Zoila Shutter, NP   Assessment & Plan:  Chief Complaint:  Chief Complaint  Patient presents with   Right Knee - Follow-up    Right total knee arthroplasty 03/18/2022   Visit Diagnoses:  1. Status post total right knee replacement     Plan: Deborah Bolton is 3 months status post right total knee replacement.  She is doing well overall has no complaints.  She does not use any assistive devices for ambulation.  Examination of the right knee shows fully healed surgical scar.  She has excellent range of motion.  Minimal swelling no tenderness to palpation.  Good varus valgus stability.  Dental prophylaxis reinforced.  Deborah Bolton is now 3 months from the knee replacement and has done very well.  She is pleased with the outcome.  She has completed physical therapy.  Recheck in 3 months with two-view x-rays of the right knee.  Follow-Up Instructions: Return in about 3 months (around 09/11/2022).   Orders:  No orders of the defined types were placed in this encounter.  No orders of the defined types were placed in this encounter.   Imaging: No results found.  PMFS History: Patient Active Problem List   Diagnosis Date Noted   Status post total right knee replacement 03/18/2022   Primary osteoarthritis of right knee 03/17/2022   Status post total replacement of right hip 10/08/2021   ALLERGIC RHINITIS 01/12/2008   ASTHMA 01/12/2008   HEADACHE, CHRONIC 01/12/2008   Past Medical History:  Diagnosis Date   Arthritis    Asthma    Complication of anesthesia    says she is "sensitive" to sedating medications   Depression    Diabetes mellitus without complication ([facility])    Dyspnea    History of kidney stones     No family history on file.  Past Surgical History:  Procedure Laterality Date   ENDOVEIN HARVEST OF GREATER SAPHENOUS VEIN Left 2011   left  leg   JOINT REPLACEMENT Left    hip arthroplasty   MANDIBLE SURGERY  1978   at age 34   TOTAL HIP ARTHROPLASTY Right 10/08/2021   Procedure: RIGHT TOTAL HIP ARTHROPLASTY ANTERIOR APPROACH, INCISIONAL VAC;  Surgeon: Leandrew Koyanagi, MD;  Location: [facility];  Service: Orthopedics;  Laterality: Right;  C-3   TOTAL KNEE ARTHROPLASTY Right 03/18/2022   Procedure: RIGHT TOTAL KNEE ARTHROPLASTY;  Surgeon: Leandrew Koyanagi, MD;  Location: [facility];  Service: Orthopedics;  Laterality: Right;   TUBAL LIGATION     Social History   Occupational History   Not on file  Tobacco Use   Smoking status: Never   Smokeless tobacco: Never  Vaping Use   Vaping Use: Never used  Substance and Sexual Activity   Alcohol use: Not Currently   Drug use: Not Currently   Sexual activity: Not on file

## 2022-09-11 ENCOUNTER — Ambulatory Visit: Payer: Medicare PPO | Admitting: Physician Assistant

## 2022-09-11 ENCOUNTER — Ambulatory Visit (INDEPENDENT_AMBULATORY_CARE_PROVIDER_SITE_OTHER): Payer: Medicare PPO

## 2022-09-11 ENCOUNTER — Encounter: Payer: Self-pay | Admitting: Physician Assistant

## 2022-09-11 DIAGNOSIS — Z96651 Presence of right artificial knee joint: Secondary | ICD-10-CM

## 2022-09-11 NOTE — Progress Notes (Signed)
Office Visit Note   Patient: Deborah Bolton           Date of Birth: 1961-02-26           MRN: [id number] Visit Date: 09/11/2022              Requested by: Zoila Shutter, NP [address] [place],  [place] [address] PCP: Zoila Shutter, NP  No chief complaint on file.     HPI: Horris Latino is 3 months status post right total knee arthroplasty she is doing well without complaints.  She has worked hard on strengthening and range of motion.  Denies any fever chills or calf pain  Assessment & Plan: Visit Diagnoses:  1. Status post total right knee replacement     Plan: 3 months status post above doing well.  Can follow-up in 6 months.  She understands about dental prophylaxis as she has had joint replacement surgery in the past.  If she has any concerns should contact us immediately  Follow-Up Instructions: Return in about 6 months (around 03/12/2023).   Ortho Exam  Patient is alert, oriented, no adenopathy, well-dressed, normal affect, normal respiratory effort. Right knee well-healed surgical incision no effusion no swelling no warmth.  She has full extension and flexion to at least 120 degrees compartments are soft and nontender she is neurovascular intact strength is intact  Imaging: XR Knee 1-2 Views Right  Result Date: 09/11/2022 2 view radiographs of her right knee were obtained today.  She is status post right total knee arthroplasty.  Components are in excellent position with good bone component interface.  No images are attached to the encounter.  Labs: Lab Results  Component Value Date   HGBA1C 6.1 (H) 02/11/2022   HGBA1C 5.9 (H) 10/04/2021     Lab Results  Component Value Date   ALBUMIN 3.7 10/04/2021    No results found for: "MG" No results found for: "VD25OH"  No results found for: "PREALBUMIN"    Latest Ref Rng & Units 03/19/2022    6:48 AM 03/11/2022   10:34 AM 10/09/2021    5:59 AM  CBC EXTENDED  WBC 4.0 - 10.5 K/uL 8.8  7.4  16.6   RBC 3.87 - 5.11  MIL/uL 4.22  4.77  4.02   Hemoglobin 12.0 - 15.0 g/dL 11.7  12.9  11.4   HCT 36.0 - 46.0 % 36.2  40.6  34.2   Platelets 150 - 400 K/uL 266  295  262      There is no height or weight on file to calculate BMI.  Orders:  Orders Placed This Encounter  Procedures   XR Knee 1-2 Views Right   No orders of the defined types were placed in this encounter.    Procedures: No procedures performed  Clinical Data: No additional findings.  ROS:  All other systems negative, except as noted in the HPI. Review of Systems  Objective: Vital Signs: There were no vitals taken for this visit.  Specialty Comments:  No specialty comments available.  PMFS History: Patient Active Problem List   Diagnosis Date Noted   Status post total right knee replacement 03/18/2022   Primary osteoarthritis of right knee 03/17/2022   Status post total replacement of right hip 10/08/2021   ALLERGIC RHINITIS 01/12/2008   ASTHMA 01/12/2008   HEADACHE, CHRONIC 01/12/2008   Past Medical History:  Diagnosis Date   Arthritis    Asthma    Complication of anesthesia    says  she is "sensitive" to sedating medications   Depression    Diabetes mellitus without complication ([place])    Dyspnea    History of kidney stones     No family history on file.  Past Surgical History:  Procedure Laterality Date   ENDOVEIN HARVEST OF GREATER SAPHENOUS VEIN Left 2011   left leg   JOINT REPLACEMENT Left    hip arthroplasty   MANDIBLE SURGERY  1978   at age 57   TOTAL HIP ARTHROPLASTY Right 10/08/2021   Procedure: RIGHT TOTAL HIP ARTHROPLASTY ANTERIOR APPROACH, INCISIONAL VAC;  Surgeon: Leandrew Koyanagi, MD;  Location: [facility];  Service: Orthopedics;  Laterality: Right;  C-3   TOTAL KNEE ARTHROPLASTY Right 03/18/2022   Procedure: RIGHT TOTAL KNEE ARTHROPLASTY;  Surgeon: Leandrew Koyanagi, MD;  Location: [facility];  Service: Orthopedics;  Laterality: Right;   TUBAL LIGATION     Social History   Occupational History   Not on file   Tobacco Use   Smoking status: Never   Smokeless tobacco: Never  Vaping Use   Vaping Use: Never used  Substance and Sexual Activity   Alcohol use: Not Currently   Drug use: Not Currently   Sexual activity: Not on file

## 2023-02-28 IMAGING — DX DG PORTABLE PELVIS
1 series · 1 of 1 positions shown · non-contrast
Comparison: 12/06/2019

CLINICAL DATA: Postop for right hip arthroplasty

EXAM:
PORTABLE PELVIS 1-2 VIEWS

[pelvis]
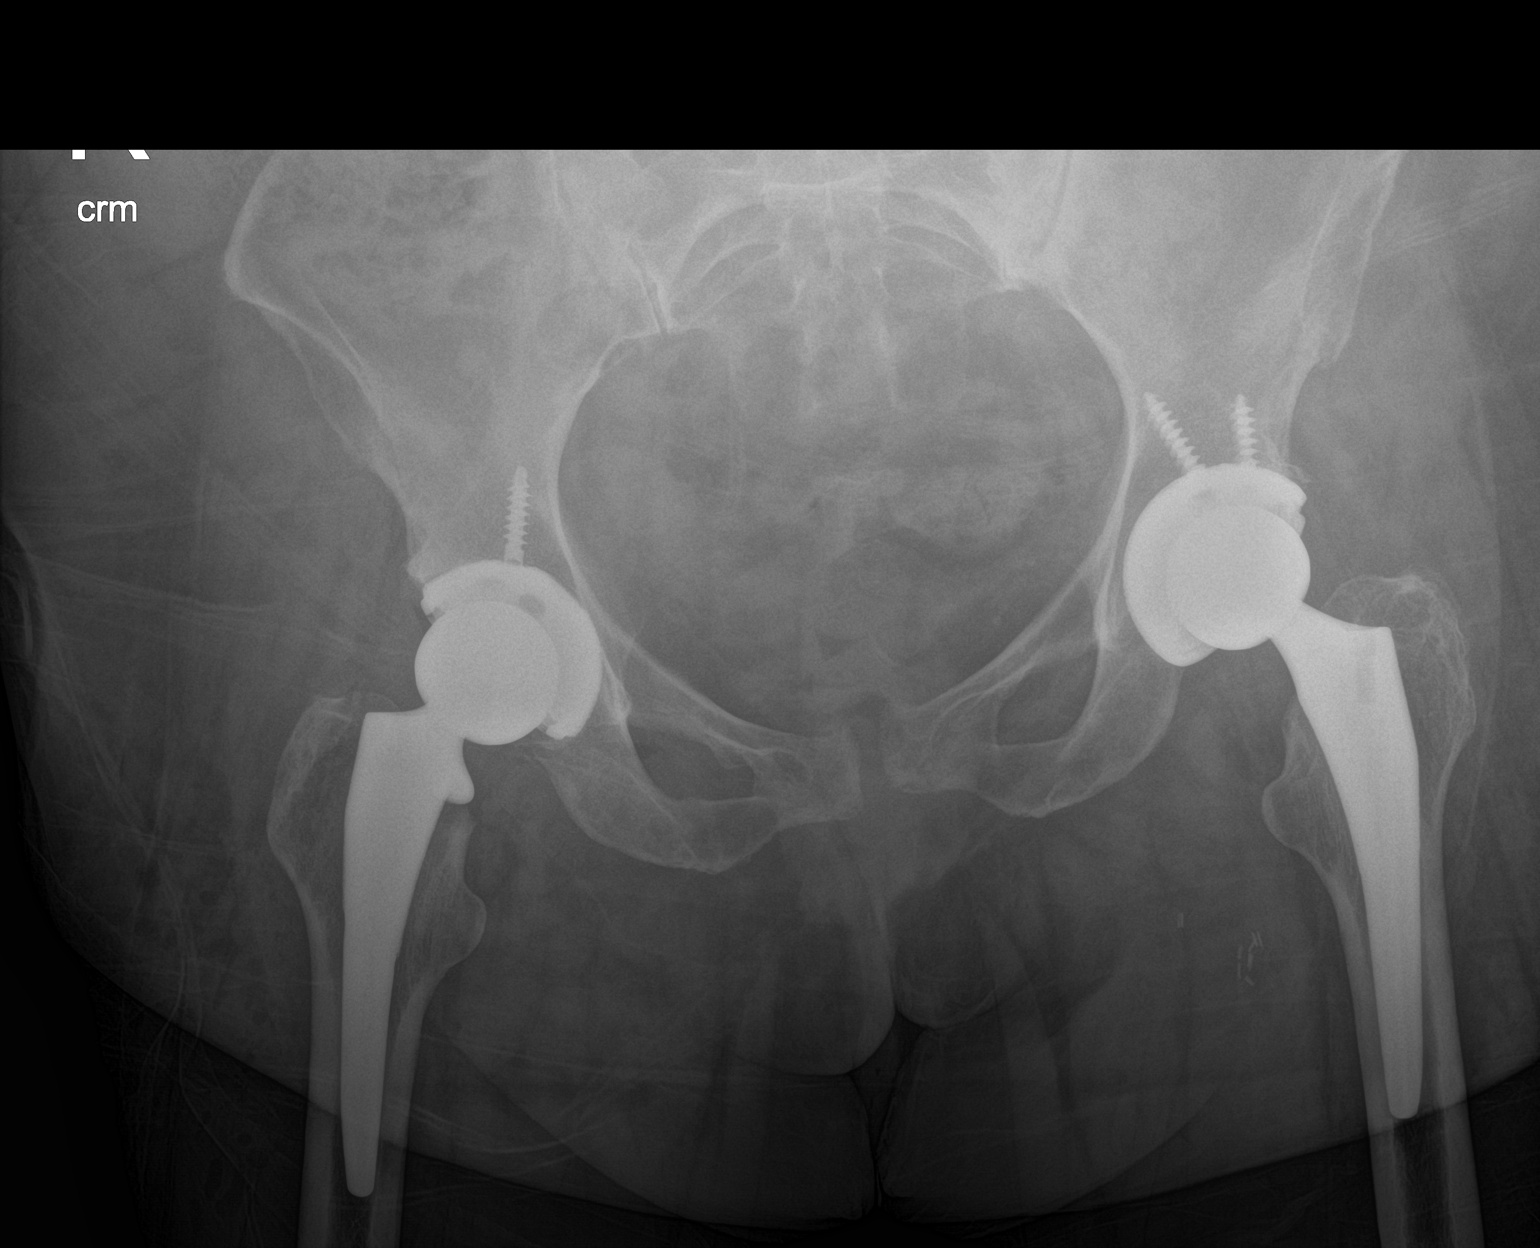

[1 of 1 positions shown; findings below may reference images not displayed]

FINDINGS: Bilateral hip arthroplasty. New on the right. No hardware
complication or periprosthetic fracture. Degenerative sclerosis of
both sacroiliac joints is slightly greater on the left than right.
IMPRESSION: Expected appearance after interval right hip arthroplasty.

## 2023-03-12 ENCOUNTER — Ambulatory Visit: Payer: Medicare PPO | Admitting: Physician Assistant

## 2023-03-20 ENCOUNTER — Encounter: Payer: Self-pay | Admitting: Physician Assistant

## 2023-03-20 ENCOUNTER — Other Ambulatory Visit: Payer: Self-pay

## 2023-03-20 ENCOUNTER — Other Ambulatory Visit (INDEPENDENT_AMBULATORY_CARE_PROVIDER_SITE_OTHER): Payer: Medicare PPO

## 2023-03-20 ENCOUNTER — Ambulatory Visit: Payer: Medicare PPO | Admitting: Physician Assistant

## 2023-03-20 DIAGNOSIS — M79641 Pain in right hand: Secondary | ICD-10-CM

## 2023-03-20 DIAGNOSIS — M79642 Pain in left hand: Secondary | ICD-10-CM | POA: Diagnosis not present

## 2023-03-20 NOTE — Progress Notes (Signed)
Office Visit Note   Patient: Deborah Bolton           Date of Birth: August 28, 1960           MRN: [id number] Visit Date: 03/20/2023              Requested by: Hortencia Conradi, NP [address] [place],  [place] [address] PCP: Hortencia Conradi, NP  Chief Complaint  Patient presents with   Right Hand - Pain   Left Hand - Pain      HPI: Deborah Bolton is a pleasant 62 year old woman who is a year status post knee arthroplasty with Dr. Roda Shutters large to that she is not having any trouble.  She does come in today with a long history of bilateral hand pain.  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  She also notices it when she is doing canning from her garden.  Left is worse than the right.  She is right-hand dominant.  Rates the pain as moderate.  Severe when she tries to use it.  Does not use any braces and not think it would be typical considering what she needs to do.  Has tried at oral Celebrex  Assessment & Plan: Visit Diagnoses:  1. Pain in left hand   2. Pain in right hand     Plan: Patient has severe degenerative changes most noted in the [facility] joint of her left hand but also scattered throughout her IP joints.  Talked about the natural history of this.  She can try using some topical Voltaren gel.  Ultimately I think she would benefit from a visit with our hand surgeon.  Could consider injections into the most painful joints first.  Follow-Up Instructions: No follow-ups on file.   Ortho Exam  Patient is alert, oriented, no adenopathy, well-dressed, normal affect, normal respiratory effort. Examination of her right hand she has a good pulse some atrophy within the hand muscle.  She does have a contracture at the DIP joint of the short finger.  She has tenderness over the [facility] joint.  Pulses intact no evidence of infection Examination of the left hand demonstrates atrophy over the first [facility] joint.  Also very tender to deep palpation.  She neurovascular intact strong  radial pulse.  Also deformity at the PIP joint of the long finger with tenderness to palpation.  Imaging: No results found. No images are attached to the encounter.  Labs: Lab Results  Component Value Date   HGBA1C 6.1 (H) 02/11/2022   HGBA1C 5.9 (H) 10/04/2021     Lab Results  Component Value Date   ALBUMIN 3.7 10/04/2021    No results found for: "MG" No results found for: "VD25OH"  No results found for: "PREALBUMIN"    Latest Ref Rng & Units 03/19/2022    6:48 AM 03/11/2022   10:34 AM 10/09/2021    5:59 AM  CBC EXTENDED  WBC 4.0 - 10.5 K/uL 8.8  7.4  16.6   RBC 3.87 - 5.11 MIL/uL 4.22  4.77  4.02   Hemoglobin 12.0 - 15.0 g/dL 69.6  29.5  28.4   HCT 36.0 - 46.0 % 36.2  40.6  34.2   Platelets 150 - 400 K/uL 266  295  262      There is no height or weight on file to calculate BMI.  Orders:  Orders Placed This Encounter  Procedures   XR Hand Complete Left  XR Hand Complete Right   Ambulatory referral to Orthopedic Surgery   No orders of the defined types were placed in this encounter.    Procedures: No procedures performed  Clinical Data: No additional findings.  ROS:  All other systems negative, except as noted in the HPI. Review of Systems  Objective: Vital Signs: There were no vitals taken for this visit.  Specialty Comments:  No specialty comments available.  PMFS History: Patient Active Problem List   Diagnosis Date Noted   Status post total right knee replacement 03/18/2022   Primary osteoarthritis of right knee 03/17/2022   Status post total replacement of right hip 10/08/2021   ALLERGIC RHINITIS 01/12/2008   ASTHMA 01/12/2008   HEADACHE, CHRONIC 01/12/2008   Past Medical History:  Diagnosis Date   Arthritis    Asthma    Complication of anesthesia    says she is "sensitive" to sedating medications   Depression    Diabetes mellitus without complication (HCC)    Dyspnea    History of kidney stones     No family history on file.   Past Surgical History:  Procedure Laterality Date   ENDOVEIN HARVEST OF GREATER SAPHENOUS VEIN Left 2011   left leg   JOINT REPLACEMENT Left    hip arthroplasty   MANDIBLE SURGERY  1978   at age 72   TOTAL HIP ARTHROPLASTY Right 10/08/2021   Procedure: RIGHT TOTAL HIP ARTHROPLASTY ANTERIOR APPROACH, INCISIONAL VAC;  Surgeon: Tarry Kos, MD;  Location[facility];  Service: Orthopedics;  Laterality: Right;  C-3   TOTAL KNEE ARTHROPLASTY Right 03/18/2022   Procedure: RIGHT TOTAL KNEE ARTHROPLASTY;  Surgeon: Tarry Kos, MD;  Location: [facility];  Service: Orthopedics;  Laterality: Right;   TUBAL LIGATION     Social History   Occupational History   Not on file  Tobacco Use   Smoking status: Never   Smokeless tobacco: Never  Vaping Use   Vaping status: Never Used  Substance and Sexual Activity   Alcohol use: Not Currently   Drug use: Not Currently   Sexual activity: Not on file

## 2023-04-18 ENCOUNTER — Ambulatory Visit: Payer: Medicare PPO | Admitting: Orthopedic Surgery

## 2023-07-07 ENCOUNTER — Ambulatory Visit: Payer: Medicare PPO | Admitting: Orthopedic Surgery

## 2023-07-07 DIAGNOSIS — M79642 Pain in left hand: Secondary | ICD-10-CM | POA: Diagnosis not present

## 2023-07-07 NOTE — Progress Notes (Signed)
Deborah Bolton - 62 y.o. female MRN [id number]  Date of birth: 06/21/1961  Office Visit Note: Visit Date: 07/07/2023 PCP: Hortencia Conradi, NP Referred by: Persons, [place], PA  Subjective: No chief complaint on file.  HPI: Deborah Bolton is a pleasant 62 y.o. female who presents today for evaluation of ongoing bilateral thumb basilar joint pain that is been present now for multiple years.  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  She is also noted ongoing weakness at the left hand with thenar atrophy.  Has not undergone any formalized treatments for the bilateral hands.  Pertinent ROS were reviewed with the patient and found to be negative unless otherwise specified above in HPI.   Visit Reason: bilateral hands Duration of symptoms: years Hand dominance: right Occupation: Disabled Diabetic: Yes/ 6.1 Smoking: No Heart/Lung History: asthma Blood Thinners:  none  Prior Testing/EMG: xrays 03/20/23 Injections (Date): none Treatments: none Prior Surgery: none  Assessment & Plan: Visit Diagnoses:  1. Pain in left hand     Plan: Extensive discussion was had the patient today regarding her bilateral thumb CMC arthritis as well as her left-sided thenar atrophy.  We reviewed her x-rays in detail today which are consistent with her clinical examination, showing significant degenerative change of the bilateral thumb CMC joints.  As far as the left-sided thenar atrophy, I would like to further investigate this with the left upper extremity electrodiagnostic workup in order to evaluate for potential nerve compression and further guide treatment.  She will return to me after her electrodiagnostic study is done to review the results and discuss potential options.  We did discuss the etiology and pathophysiology of carpal tunnel syndrome as well as the significance of thenar atrophy.  As for the thumb CMC arthritis, we will begin with bilateral Comfort Cool bracing to help  mitigate symptoms.  We discussed other conservative options in the form of medications, injections, topical remedies, activity modification and potential therapy.  We also discussed the potential for thumb CMC arthroplasty in the future, surgical technique and postoperative protocol as well as all risk and benefits associated.  Greater than 30 minutes was spent in the care of this patient today.  Follow-up: No follow-ups on file.   Meds & Orders: No orders of the defined types were placed in this encounter.   Orders Placed This Encounter  Procedures   Ambulatory referral to Physical Medicine Rehab     Procedures: No procedures performed      Clinical History: No specialty comments available.  She reports that she has never smoked. She has never used smokeless tobacco. No results for input(s): "HGBA1C", "LABURIC" in the last 8760 hours.  Objective:   Vital Signs: There were no vitals taken for this visit.  Physical Exam  Gen: Well-appearing, in no acute distress; non-toxic CV: Regular Rate. Well-perfused. Warm.  Resp: Breathing unlabored on room air; no wheezing. Psych: Fluid speech in conversation; appropriate affect; normal thought process  Ortho Exam General: Patient is well appearing and in no distress. Cervical spine mobility is full in all directions:   Skin and Muscle: No skin changes are apparent to upper extremities.  Muscle bulk and contour normal, no signs of atrophy.      Range of Motion and Palpation Tests: Mobility is full about the elbows with flexion and extension.  Forearm supination and pronation are 85/85 bilaterally.  Wrist flexion/extension is 75/65 bilaterally.  Digital flexion and extension are  full.  Thumb opposition is full to the base of the small fingers bilaterally.     No cords or nodules are palpated.  No triggering is observed.     Significant tenderness over the bilateral thumb CMC articulation is observed, positive grind, positive crepitus.   MP hyperextension mild, less than 10 degrees bilaterally.      Neurologic, Vascular, Motor: Sensation is intact to light touch in the median/radial/ulnar distributions.  Tinel's testing mildly positive left wrist level. Phalen's mildly positive left, Derkan's compression mildly positive left. Notable thenar atrophy seen at the left hand, APB 4/5 No significant thenar atrophy of the right hand  Fingers pink and well perfused.  Capillary refill is brisk.     Imaging: No results found.  Past Medical/Family/Surgical/Social History: Medications & Allergies reviewed per EMR, new medications updated. Patient Active Problem List   Diagnosis Date Noted   Status post total right knee replacement 03/18/2022   Primary osteoarthritis of right knee 03/17/2022   Status post total replacement of right hip 10/08/2021   ALLERGIC RHINITIS 01/12/2008   ASTHMA 01/12/2008   HEADACHE, CHRONIC 01/12/2008   Past Medical History:  Diagnosis Date   Arthritis    Asthma    Complication of anesthesia    says she is "sensitive" to sedating medications   Depression    Diabetes mellitus without complication (HCC)    Dyspnea    History of kidney stones    No family history on file. Past Surgical History:  Procedure Laterality Date   ENDOVEIN HARVEST OF GREATER SAPHENOUS VEIN Left 2011   left leg   JOINT REPLACEMENT Left    hip arthroplasty   MANDIBLE SURGERY  1978   at age 16   TOTAL HIP ARTHROPLASTY Right 10/08/2021   Procedure: RIGHT TOTAL HIP ARTHROPLASTY ANTERIOR APPROACH, INCISIONAL VAC;  Surgeon: Tarry Kos, MD;  Location: [facility];  Service: Orthopedics;  Laterality: Right;  C-3   TOTAL KNEE ARTHROPLASTY Right 03/18/2022   Procedure: RIGHT TOTAL KNEE ARTHROPLASTY;  Surgeon: Tarry Kos, MD;  Location: [facility];  Service: Orthopedics;  Laterality: Right;   TUBAL LIGATION     Social History   Occupational History   Not on file  Tobacco Use   Smoking status: Never   Smokeless tobacco: Never   Vaping Use   Vaping status: Never Used  Substance and Sexual Activity   Alcohol use: Not Currently   Drug use: Not Currently   Sexual activity: Not on file    Dayden Viverette Fara Boros) Denese Killings, M.D. [facility] 3:16 PM

## 2023-07-18 ENCOUNTER — Ambulatory Visit: Payer: Medicare PPO | Admitting: Physical Medicine and Rehabilitation

## 2023-07-18 ENCOUNTER — Encounter: Payer: Self-pay | Admitting: Physical Medicine and Rehabilitation

## 2023-07-18 DIAGNOSIS — R531 Weakness: Secondary | ICD-10-CM

## 2023-07-18 DIAGNOSIS — M79642 Pain in left hand: Secondary | ICD-10-CM

## 2023-07-18 DIAGNOSIS — R202 Paresthesia of skin: Secondary | ICD-10-CM | POA: Diagnosis not present

## 2023-07-18 DIAGNOSIS — M79645 Pain in left finger(s): Secondary | ICD-10-CM

## 2023-07-18 DIAGNOSIS — G8929 Other chronic pain: Secondary | ICD-10-CM

## 2023-07-18 NOTE — Procedures (Unsigned)
EMG & NCV Findings: All nerve conduction studies (as indicated in the following tables) were within normal limits.    All examined muscles (as indicated in the following table) showed no evidence of electrical instability.    Impression: Essentially NORMAL electrodiagnostic study of the left upper limb.  There is no significant electrodiagnostic evidence of nerve entrapment, brachial plexopathy or cervical radiculopathy.  In my experience apparent atrophy can be misleading in the situation with pretty significant CMC joint arthritis.  There seems to be no nerve findings to explain muscle atrophy from denervation.  As you know, purely sensory or demyelinating radiculopathies and chemical radiculitis may not be detected with this particular electrodiagnostic study.  Recommendations: 1.  Follow-up with referring physician.  2.  Continue current management of symptoms.  ___________________________ Deborah Bolton FAAPMR Board Certified, American Board of Physical Medicine and Rehabilitation    Nerve Conduction Studies Anti Sensory Summary Table   Stim Site NR Peak (ms) Norm Peak (ms) P-T Amp (V) Norm P-T Amp Site1 Site2 Delta-P (ms) Dist (cm) Vel (m/s) Norm Vel (m/s)  Left Median Acr Palm Anti Sensory (2nd Digit)  30.6C  Wrist    3.2 <3.6 29.0 >10 Wrist Palm 1.8 0.0    Palm    1.4 <2.0 7.6         Left Radial Anti Sensory (Base 1st Digit)  31.3C  Wrist    2.1 <3.1 21.0  Wrist Base 1st Digit 2.1 0.0    Left Ulnar Anti Sensory (5th Digit)  31.7C  Wrist    3.1 <3.7 23.9 >15.0 Wrist 5th Digit 3.1 14.0 45 >38   Motor Summary Table   Stim Site NR Onset (ms) Norm Onset (ms) O-P Amp (mV) Norm O-P Amp Site1 Site2 Delta-0 (ms) Dist (cm) Vel (m/s) Norm Vel (m/s)  Left Median Motor (Abd Poll Brev)  31.9C  Wrist    3.0 <4.2 6.4 >5 Elbow Wrist 3.9 20.0 51 >50  Elbow    6.9  5.8         Left Ulnar Motor (Abd Dig Min)  32.1C  Wrist    2.9 <4.2 5.4 >3 B Elbow Wrist 3.4 20.0 59 >53  B Elbow     6.3  5.0  A Elbow B Elbow 1.3 10.0 77 >53  A Elbow    7.6  4.7          EMG   Side Muscle Nerve Root Ins Act Fibs Psw Amp Dur Poly Recrt Int Dennie Bible Comment  Left Abd Poll Brev Median C8-T1 Nml Nml Nml Nml Nml 0 Nml Nml   Left 1stDorInt Ulnar C8-T1 Nml Nml Nml Nml Nml 0 Nml Nml   Left PronatorTeres Median C6-7 Nml Nml Nml Nml Nml 0 Nml Nml   Left Biceps Musculocut C5-6 Nml Nml Nml Nml Nml 0 Nml Nml     Nerve Conduction Studies Anti Sensory Left/Right Comparison   Stim Site L Lat (ms) R Lat (ms) L-R Lat (ms) L Amp (V) R Amp (V) L-R Amp (%) Site1 Site2 L Vel (m/s) R Vel (m/s) L-R Vel (m/s)  Median Acr Palm Anti Sensory (2nd Digit)  30.6C  Wrist 3.2   29.0   Wrist Palm     Palm 1.4   7.6         Radial Anti Sensory (Base 1st Digit)  31.3C  Wrist 2.1   21.0   Wrist Base 1st Digit     Ulnar Anti Sensory (5th Digit)  31.7C  Wrist 3.1  23.9   Wrist 5th Digit 45     Motor Left/Right Comparison   Stim Site L Lat (ms) R Lat (ms) L-R Lat (ms) L Amp (mV) R Amp (mV) L-R Amp (%) Site1 Site2 L Vel (m/s) R Vel (m/s) L-R Vel (m/s)  Median Motor (Abd Poll Brev)  31.9C  Wrist 3.0   6.4   Elbow Wrist 51    Elbow 6.9   5.8         Ulnar Motor (Abd Dig Min)  32.1C  Wrist 2.9   5.4   B Elbow Wrist 59    B Elbow 6.3   5.0   A Elbow B Elbow 77    A Elbow 7.6   4.7            Waveforms:

## 2023-07-18 NOTE — Progress Notes (Unsigned)
   Deborah Bolton - 62 y.o. female MRN [id number]  Date of birth: 23-Oct-1960  Office Visit Note: Visit Date: 07/18/2023 PCP: Hortencia Conradi, NP Referred by: Samuella Cota, MD  Subjective: Chief Complaint  Patient presents with   Left Hand - Pain   HPI: Deborah Bolton is a 62 y.o. female who comes in todayHPI   ROS Otherwise per HPI.  Assessment & Plan: Visit Diagnoses:    ICD-10-CM   1. Paresthesia of skin  R20.2 NCV with EMG (electromyography)       Plan: No additional findings.   Meds & Orders: No orders of the defined types were placed in this encounter.   Orders Placed This Encounter  Procedures   NCV with EMG (electromyography)    Follow-up: No follow-ups on file.   Procedures: No procedures performed      Clinical History: No specialty comments available.   She reports that she has never smoked. She has never used smokeless tobacco. No results for input(s): "HGBA1C", "LABURIC" in the last 8760 hours.  Objective:  VS:  HT:    WT:   BMI:     BP:   HR: bpm  TEMP: ( )  RESP:  Physical Exam  Ortho Exam  Imaging: No results found.  Past Medical/Family/Surgical/Social History: Medications & Allergies reviewed per EMR, new medications updated. Patient Active Problem List   Diagnosis Date Noted   Status post total right knee replacement 03/18/2022   Primary osteoarthritis of right knee 03/17/2022   Status post total replacement of right hip 10/08/2021   Femoral nerve lesion, left 07/10/2020   Left leg weakness 05/10/2020   Type 2 diabetes mellitus with neurologic complication, without long-term current use of insulin (HCC) 05/10/2020   Class 2 obesity with body mass index (BMI) of 37.0 to 37.9 in adult 04/24/2020   ALLERGIC RHINITIS 01/12/2008   ASTHMA 01/12/2008   HEADACHE, CHRONIC 01/12/2008   Past Medical History:  Diagnosis Date   Arthritis    Asthma    Complication of anesthesia    says she is "sensitive" to sedating medications    Depression    Diabetes mellitus without complication (HCC)    Dyspnea    History of kidney stones    History reviewed. No pertinent family history. Past Surgical History:  Procedure Laterality Date   ENDOVEIN HARVEST OF GREATER SAPHENOUS VEIN Left 2011   left leg   JOINT REPLACEMENT Left    hip arthroplasty   MANDIBLE SURGERY  1978   at age 84   TOTAL HIP ARTHROPLASTY Right 10/08/2021   Procedure: RIGHT TOTAL HIP ARTHROPLASTY ANTERIOR APPROACH, INCISIONAL VAC;  Surgeon: Tarry Kos, MD;  Location: [facility];  Service: Orthopedics;  Laterality: Right;  C-3   TOTAL KNEE ARTHROPLASTY Right 03/18/2022   Procedure: RIGHT TOTAL KNEE ARTHROPLASTY;  Surgeon: Tarry Kos, MD;  Location: [facility];  Service: Orthopedics;  Laterality: Right;   TUBAL LIGATION     Social History   Occupational History   Not on file  Tobacco Use   Smoking status: Never   Smokeless tobacco: Never  Vaping Use   Vaping status: Never Used  Substance and Sexual Activity   Alcohol use: Not Currently   Drug use: Not Currently   Sexual activity: Not on file

## 2023-07-18 NOTE — Progress Notes (Unsigned)
R hand dom.

## 2023-07-21 ENCOUNTER — Telehealth: Payer: Self-pay | Admitting: Orthopedic Surgery

## 2023-07-21 NOTE — Telephone Encounter (Signed)
Called pt advised her Dr. Fara Boros wanted to see her back in office we can work her in on the 23rd let Lequita Halt know what time if not she will have to choose another day after the holidays, please advise if pt calls back in

## 2023-08-08 ENCOUNTER — Encounter (HOSPITAL_BASED_OUTPATIENT_CLINIC_OR_DEPARTMENT_OTHER): Payer: Self-pay

## 2023-08-08 ENCOUNTER — Emergency Department (HOSPITAL_BASED_OUTPATIENT_CLINIC_OR_DEPARTMENT_OTHER): Payer: Medicare PPO

## 2023-08-08 ENCOUNTER — Other Ambulatory Visit: Payer: Self-pay

## 2023-08-08 ENCOUNTER — Emergency Department (HOSPITAL_BASED_OUTPATIENT_CLINIC_OR_DEPARTMENT_OTHER)
Admission: EM | Admit: 2023-08-08 | Discharge: 2023-08-08 | Disposition: A | Discharge status: Home / Self Care | Payer: Medicare PPO | Attending: Emergency Medicine | Admitting: Emergency Medicine

## 2023-08-08 DIAGNOSIS — E119 Type 2 diabetes mellitus without complications: Secondary | ICD-10-CM | POA: Diagnosis not present

## 2023-08-08 DIAGNOSIS — J45909 Unspecified asthma, uncomplicated: Secondary | ICD-10-CM | POA: Diagnosis not present

## 2023-08-08 DIAGNOSIS — Z7984 Long term (current) use of oral hypoglycemic drugs: Secondary | ICD-10-CM | POA: Diagnosis not present

## 2023-08-08 DIAGNOSIS — J4521 Mild intermittent asthma with (acute) exacerbation: Secondary | ICD-10-CM | POA: Diagnosis not present

## 2023-08-08 DIAGNOSIS — Z96651 Presence of right artificial knee joint: Secondary | ICD-10-CM | POA: Insufficient documentation

## 2023-08-08 DIAGNOSIS — R0602 Shortness of breath: Secondary | ICD-10-CM | POA: Diagnosis present

## 2023-08-08 DIAGNOSIS — Z7952 Long term (current) use of systemic steroids: Secondary | ICD-10-CM | POA: Insufficient documentation

## 2023-08-08 DIAGNOSIS — Z20822 Contact with and (suspected) exposure to covid-19: Secondary | ICD-10-CM | POA: Insufficient documentation

## 2023-08-08 DIAGNOSIS — Z87442 Personal history of urinary calculi: Secondary | ICD-10-CM | POA: Insufficient documentation

## 2023-08-08 LAB — CBG MONITORING, ED: Glucose-Capillary: 119 mg/dL — ABNORMAL HIGH (ref 70–99)

## 2023-08-08 LAB — CBC WITH DIFFERENTIAL/PLATELET
Abs Immature Granulocytes: 0.09 10*3/uL — ABNORMAL HIGH (ref 0.00–0.07)
Basophils Absolute: 0 10*3/uL (ref 0.0–0.1)
Basophils Relative: 0 %
Eosinophils Absolute: 0.4 10*3/uL (ref 0.0–0.5)
Eosinophils Relative: 4 %
HCT: 41 % (ref 36.0–46.0)
Hemoglobin: 13.7 g/dL (ref 12.0–15.0)
Immature Granulocytes: 1 %
Lymphocytes Relative: 27 %
Lymphs Abs: 2.8 10*3/uL (ref 0.7–4.0)
MCH: 27.8 pg (ref 26.0–34.0)
MCHC: 33.4 g/dL (ref 30.0–36.0)
MCV: 83.2 fL (ref 80.0–100.0)
Monocytes Absolute: 0.6 10*3/uL (ref 0.1–1.0)
Monocytes Relative: 6 %
Neutro Abs: 6.6 10*3/uL (ref 1.7–7.7)
Neutrophils Relative %: 62 %
Platelets: 332 10*3/uL (ref 150–400)
RBC: 4.93 MIL/uL (ref 3.87–5.11)
RDW: 15 % (ref 11.5–15.5)
WBC: 10.5 10*3/uL (ref 4.0–10.5)
nRBC: 0 % (ref 0.0–0.2)

## 2023-08-08 LAB — COMPREHENSIVE METABOLIC PANEL
ALT: 22 U/L (ref 0–44)
AST: 22 U/L (ref 15–41)
Albumin: 3.7 g/dL (ref 3.5–5.0)
Alkaline Phosphatase: 84 U/L (ref 38–126)
Anion gap: 8 (ref 5–15)
BUN: 22 mg/dL (ref 8–23)
CO2: 24 mmol/L (ref 22–32)
Calcium: 8.9 mg/dL (ref 8.9–10.3)
Chloride: 106 mmol/L (ref 98–111)
Creatinine, Ser: 0.92 mg/dL (ref 0.44–1.00)
GFR, Estimated: >60 mL/min (ref >60)
Glucose, Bld: 124 mg/dL — ABNORMAL HIGH (ref 70–99)
Potassium: 3.8 mmol/L (ref 3.5–5.1)
Sodium: 138 mmol/L (ref 135–145)
Total Bilirubin: 0.3 mg/dL (ref 0.0–1.2)
Total Protein: 7 g/dL (ref 6.5–8.1)

## 2023-08-08 LAB — RESP PANEL BY RT-PCR (RSV, FLU A&B, COVID)  RVPGX2
Influenza A by PCR: NEGATIVE
Influenza B by PCR: NEGATIVE
Resp Syncytial Virus by PCR: NEGATIVE
SARS Coronavirus 2 by RT PCR: NEGATIVE

## 2023-08-08 LAB — D-DIMER, QUANTITATIVE: D-Dimer, Quant: 0.35 ug{FEU}/mL (ref 0.00–0.50)

## 2023-08-08 LAB — BRAIN NATRIURETIC PEPTIDE: B Natriuretic Peptide: 40.3 pg/mL (ref 0.0–100.0)

## 2023-08-08 LAB — TROPONIN I (HIGH SENSITIVITY): Troponin I (High Sensitivity): 4 ng/L (ref <18)

## 2023-08-08 MED ORDER — PREDNISONE 50 MG PO TABS: 50.0000 mg | ORAL_TABLET | Freq: Every day | ORAL | 0 refills | Status: AC

## 2023-08-08 MED ORDER — ALBUTEROL SULFATE (2.5 MG/3ML) 0.083% IN NEBU
5.0000 mg | INHALATION_SOLUTION | Freq: Once | RESPIRATORY_TRACT | Status: AC
  Administered 2023-08-08: 5 mg via RESPIRATORY_TRACT
  Filled 2023-08-08: qty 6

## 2023-08-08 MED ORDER — METHYLPREDNISOLONE SODIUM SUCC 125 MG IJ SOLR
125.0000 mg | Freq: Once | INTRAMUSCULAR | Status: AC
  Administered 2023-08-08: 125 mg via INTRAVENOUS
  Filled 2023-08-08: qty 2

## 2023-08-08 NOTE — ED Triage Notes (Signed)
 The patient having cough and shortness of breath for week.

## 2023-08-08 NOTE — ED Provider Notes (Signed)
 [facility] Provider Note  CSN: [id number] Arrival date & time: 08/08/23 1659  Chief Complaint(s) Shortness of Breath and Cough  HPI Deborah Bolton is a 63 y.o. female history of diabetes, obesity, asthma presenting with shortness of breath.  Reports that around a week ago was feeling sick, went to an urgent care, was diagnosed with possible pneumonia, was given a prescription for prednisone , Augmentin, Z-Pak, reports that she has had persistent dyspnea, no chest pain, abdominal pain, back pain.  Denies any runny nose or sore throat.  Reports dry cough.  She saw her primary doctor today, they gave her DuoNeb, she reports feeling somewhat better since then but still feeling dyspneic.  No leg swelling.    Past Medical History Past Medical History:  Diagnosis Date   Arthritis    Asthma    Complication of anesthesia    says she is sensitive to sedating medications   Depression    Diabetes mellitus without complication (HCC)    Dyspnea    History of kidney stones    Patient Active Problem List   Diagnosis Date Noted   Status post total right knee replacement 03/18/2022   Primary osteoarthritis of right knee 03/17/2022   Status post total replacement of right hip 10/08/2021   Femoral nerve lesion, left 07/10/2020   Left leg weakness 05/10/2020   Type 2 diabetes mellitus with neurologic complication, without long-term current use of insulin  (HCC) 05/10/2020   Class 2 obesity with body mass index (BMI) of 37.0 to 37.9 in adult 04/24/2020   ALLERGIC RHINITIS 01/12/2008   ASTHMA 01/12/2008   HEADACHE, CHRONIC 01/12/2008   Home Medication(s) Prior to Admission medications   Medication Sig Start Date End Date Taking? Authorizing Provider  predniSONE  (DELTASONE ) 50 MG tablet Take 1 tablet (50 mg total) by mouth daily for 4 days. 08/09/23 08/13/23 Yes Francesca Elsie LITTIE, MD  albuterol  (PROVENTIL ) (2.5 MG/3ML) 0.083% nebulizer solution Take 2.5 mg  by nebulization every 6 (six) hours as needed for wheezing or shortness of breath.    [provider]  albuterol  (VENTOLIN  HFA) 108 (90 Base) MCG/ACT inhaler Inhale 2 puffs into the lungs every 6 (six) hours as needed for wheezing or shortness of breath.    [provider]  cefadroxil  (DURICEF) 500 MG capsule Take 1 capsule (500 mg total) by mouth 2 (two) times daily. 04/25/22   Jerri Kay HERO, MD  celecoxib (CELEBREX) 100 MG capsule Take 100 mg by mouth 2 (two) times daily. 07/07/23   [provider]  ferrous sulfate  325 (65 FE) MG tablet Take by mouth. 05/10/20   [provider]  FLUoxetine  (PROZAC ) 20 MG capsule Take 20 mg by mouth at bedtime.    [provider]  fluticasone  (FLONASE ) 50 MCG/ACT nasal spray Place 1 spray into both nostrils 2 (two) times daily.    [provider]  metFORMIN (GLUCOPHAGE) 500 MG tablet Take by mouth. 05/01/20   [provider]  methocarbamol  (ROBAXIN ) 500 MG tablet Take 1 tablet (500 mg total) by mouth 2 (two) times daily as needed. 03/10/22   Jule Ronal LITTIE, PA-C  ondansetron  (ZOFRAN ) 4 MG tablet Take 1 tablet (4 mg total) by mouth every 8 (eight) hours as needed for nausea or vomiting. 03/10/22   Jule Ronal LITTIE, PA-C  oxybutynin (DITROPAN XL) 15 MG 24 hr tablet Take 15 mg by mouth 2 (two) times daily.    [provider]  oxyCODONE -acetaminophen  (PERCOCET) 5-325 MG  tablet Take 1 tablet by mouth every 8 (eight) hours as needed. 04/09/22   Jule Ronal CROME, PA-C  pantoprazole  (PROTONIX ) 40 MG tablet Take 40 mg by mouth daily. 06/11/23   [provider]  TRULICITY  3 MG/0.5ML SOPN Inject 3 mg into the skin every Sunday. 02/28/22   [provider]                                                                                                                                    Past Surgical History Past Surgical History:  Procedure Laterality Date   ENDOVEIN HARVEST OF GREATER SAPHENOUS  VEIN Left 2011   left leg   JOINT REPLACEMENT Left    hip arthroplasty   MANDIBLE SURGERY  1978   at age 53   TOTAL HIP ARTHROPLASTY Right 10/08/2021   Procedure: RIGHT TOTAL HIP ARTHROPLASTY ANTERIOR APPROACH, INCISIONAL VAC;  Surgeon: Jerri Kay HERO, MD;  Location: [facility] OR;  Service: Orthopedics;  Laterality: Right;  C-3   TOTAL KNEE ARTHROPLASTY Right 03/18/2022   Procedure: RIGHT TOTAL KNEE ARTHROPLASTY;  Surgeon: Jerri Kay HERO, MD;  Location: [facility] OR;  Service: Orthopedics;  Laterality: Right;   TUBAL LIGATION     Family History History reviewed. No pertinent family history.  Social History Social History   Tobacco Use   Smoking status: Never   Smokeless tobacco: Never  Vaping Use   Vaping status: Never Used  Substance Use Topics   Alcohol use: Not Currently   Drug use: Not Currently   Allergies Iodine and Other  Review of Systems Review of Systems  All other systems reviewed and are negative.   Physical Exam Vital Signs  I have reviewed the triage vital signs BP 118/74 (BP Location: Left Arm)   Pulse 76   Temp 98.6 F (37 C)   Resp (!) 22   Ht 5' 6 (1.676 m)   Wt 103 kg   SpO2 94%   BMI 36.65 kg/m  Physical Exam Vitals and nursing note reviewed.  Constitutional:      General: She is not in acute distress.    Appearance: She is well-developed.  HENT:     Head: Normocephalic and atraumatic.     Mouth/Throat:     Mouth: Mucous membranes are moist.  Eyes:     Pupils: Pupils are equal, round, and reactive to light.  Cardiovascular:     Rate and Rhythm: Normal rate and regular rhythm.     Heart sounds: No murmur heard. Pulmonary:     Comments: Mild increased work of breathing, lung sounds clear throughout Abdominal:     General: Abdomen is flat.     Palpations: Abdomen is soft.     Tenderness: There is no abdominal tenderness.  Musculoskeletal:        General: No tenderness.     Right lower leg: No edema.     Left lower leg: No edema.  Skin:     General: Skin is warm and dry.  Neurological:     General: No focal deficit present.     Mental Status: She is alert. Mental status is at baseline.  Psychiatric:        Mood and Affect: Mood normal.        Behavior: Behavior normal.     ED Results and Treatments Labs (all labs ordered are listed, but only abnormal results are displayed) Labs Reviewed  CBC WITH DIFFERENTIAL/PLATELET - Abnormal; Notable for the following components:      Result Value   Abs Immature Granulocytes 0.09 (*)    All other components within normal limits  COMPREHENSIVE METABOLIC PANEL - Abnormal; Notable for the following components:   Glucose, Bld 124 (*)    All other components within normal limits  CBG MONITORING, ED - Abnormal; Notable for the following components:   Glucose-Capillary 119 (*)    All other components within normal limits  RESP PANEL BY RT-PCR (RSV, FLU A&B, COVID)  RVPGX2  D-DIMER, QUANTITATIVE  BRAIN NATRIURETIC PEPTIDE  TROPONIN I (HIGH SENSITIVITY)                                                                                                                          Radiology DG Chest 2 View Result Date: 08/08/2023 CLINICAL DATA:  Cough and fever EXAM: CHEST - 2 VIEW COMPARISON:  Chest x-ray 10/14/2020 FINDINGS: The heart size and mediastinal contours are within normal limits. Both lungs are clear. The visualized skeletal structures are unremarkable. IMPRESSION: No active cardiopulmonary disease. Electronically Signed   By: Greig Pique M.D.   On: 08/08/2023 19:16    Pertinent labs & imaging results that were available during my care of the patient were reviewed by me and considered in my medical decision making (see MDM for details).  Medications Ordered in ED Medications  methylPREDNISolone  sodium succinate (SOLU-MEDROL ) 125 mg/2 mL injection 125 mg (has no administration in time range)  albuterol  (PROVENTIL ) (2.5 MG/3ML) 0.083% nebulizer solution 5 mg (5 mg Nebulization Given  08/08/23 1714)                                                                                                                                     Procedures Procedures  (including critical care time)  Medical Decision Making / ED Course   MDM:  63 year old presenting to  the emergency department with shortness of breath.  On exam, patient is well-appearing, does have mild increased work of breathing, no tachypnea however.  No wheezing on exam.  She does report she feels better after receiving DuoNeb earlier.  Patient does have history of asthma, could be exacerbation of this, but she does report she was on a recent course of steroids.  Lower concern for CHF, no leg swelling, no crackles.  My interpretation chest x-ray appears clear without signs of pneumonia, but will follow-up radiology read.  No sign of pneumothorax on my interpretation.  Labs reassuring so far.  No chest pain to suggest ACS, will check EKG.  Per RT patient did have some wheezing on arrival which seems to have resolved after additional albuterol  treatment.  Considered pulmonary embolism, no concerning history but will check D-dimer given age and dyspnea.  Will check BNP although low concern for CHF.  Will reassess.  Clinical Course as of 08/08/23 2008  Fri Aug 08, 2023  2006 BNP is negative, D-dimer is negative.  Troponin is also negative.  Chest x-ray read as no acute process.  Agree with radiology interpretation.  Patient is feeling somewhat better.  Suspect underlying asthma as the cause of her symptoms.  She did relatively recently completed course of steroids and now her symptoms have returned.  Will give dose of Solu-Medrol  in emergency department and 4-day course of prednisone  starting tomorrow.  Advise close follow-up with her primary doctor. Will discharge patient to home. All questions answered. Patient comfortable with plan of discharge. Return precautions discussed with patient and specified on the after visit  summary.  [WS]    Clinical Course User Index [WS] Francesca Elsie CROME, MD     Additional history obtained: -Additional history obtained from family and spouse    Lab Tests: -I ordered, reviewed, and interpreted labs.   The pertinent results include:   Labs Reviewed  CBC WITH DIFFERENTIAL/PLATELET - Abnormal; Notable for the following components:      Result Value   Abs Immature Granulocytes 0.09 (*)    All other components within normal limits  COMPREHENSIVE METABOLIC PANEL - Abnormal; Notable for the following components:   Glucose, Bld 124 (*)    All other components within normal limits  CBG MONITORING, ED - Abnormal; Notable for the following components:   Glucose-Capillary 119 (*)    All other components within normal limits  RESP PANEL BY RT-PCR (RSV, FLU A&B, COVID)  RVPGX2  D-DIMER, QUANTITATIVE  BRAIN NATRIURETIC PEPTIDE  TROPONIN I (HIGH SENSITIVITY)    Notable for very mild hyperglycemia   EKG  NSR no STEMI   Imaging Studies ordered: I ordered imaging studies including CXR On my interpretation imaging demonstrates no acute process  I independently visualized and interpreted imaging. I agree with the radiologist interpretation   Medicines ordered and prescription drug management: Meds ordered this encounter  Medications   albuterol  (PROVENTIL ) (2.5 MG/3ML) 0.083% nebulizer solution 5 mg   methylPREDNISolone  sodium succinate (SOLU-MEDROL ) 125 mg/2 mL injection 125 mg    IV methylprednisolone  will be converted to either a q12h or q24h frequency with the same total daily dose (TDD).  Ordered Dose: 1 to 125 mg TDD; convert to: TDD q24h.  Ordered Dose: 126 to 250 mg TDD; convert to: TDD div q12h.  Ordered Dose: >250 mg TDD; DAW.   predniSONE  (DELTASONE ) 50 MG tablet    Sig: Take 1 tablet (50 mg total) by mouth daily for 4 days.    Dispense:  4 tablet    Refill:  0    -I have reviewed the patients home medicines and have made adjustments as  needed  Cardiac Monitoring: The patient was maintained on a cardiac monitor.  I personally viewed and interpreted the cardiac monitored which showed an underlying rhythm of: NSR  Social Determinants of Health:  Diagnosis or treatment significantly limited by social determinants of health: obesity   Reevaluation: After the interventions noted above, I reevaluated the patient and found that their symptoms have improved  Co morbidities that complicate the patient evaluation  Past Medical History:  Diagnosis Date   Arthritis    Asthma    Complication of anesthesia    says she is sensitive to sedating medications   Depression    Diabetes mellitus without complication (HCC)    Dyspnea    History of kidney stones       Dispostion: Disposition decision including need for hospitalization was considered, and patient discharged from emergency department.    Final Clinical Impression(s) / ED Diagnoses Final diagnoses:  Mild intermittent asthma with exacerbation     This chart was dictated using voice recognition software.  Despite best efforts to proofread,  errors can occur which can change the documentation meaning.    Francesca Elsie CROME, MD 08/08/23 2008

## 2023-08-08 NOTE — ED Notes (Signed)
 ED Provider at bedside.

## 2023-08-08 NOTE — Discharge Instructions (Signed)
 We evaluated you for your shortness of breath.  Your testing was reassuring including your heart enzyme testing as well as your chest x-ray.  We do not think that your symptoms are due to a pneumonia, blood clot, heart failure, or other dangerous process.  Your symptoms are most likely due to your asthma.  I have prescribed you another course of steroids.  Please take these as prescribed starting tomorrow.  We did give you dose of steroids today in the emergency department.  Please follow-up closely with your primary doctor.  Please return if you have any new or worsening symptoms such as worsening difficulty breathing, lightheadedness or dizziness, fainting, chest pain, severe pain, or any other new symptoms.

## 2023-08-25 ENCOUNTER — Ambulatory Visit: Payer: Medicare PPO | Admitting: Orthopedic Surgery

## 2024-01-23 ENCOUNTER — Encounter: Payer: Self-pay | Admitting: Nurse Practitioner

## 2024-01-23 ENCOUNTER — Other Ambulatory Visit: Payer: Self-pay | Admitting: Nurse Practitioner

## 2024-01-23 DIAGNOSIS — E042 Nontoxic multinodular goiter: Secondary | ICD-10-CM

## 2024-01-27 ENCOUNTER — Ambulatory Visit
Admission: RE | Admit: 2024-01-27 | Discharge: 2024-01-27 | Disposition: A | Discharge status: Home / Self Care | Source: Ambulatory Visit | Attending: Nurse Practitioner | Admitting: Nurse Practitioner

## 2024-01-27 DIAGNOSIS — E042 Nontoxic multinodular goiter: Secondary | ICD-10-CM

## 2024-01-30 ENCOUNTER — Other Ambulatory Visit: Payer: Self-pay | Admitting: Nurse Practitioner

## 2024-01-30 DIAGNOSIS — E042 Nontoxic multinodular goiter: Secondary | ICD-10-CM

## 2024-02-10 ENCOUNTER — Ambulatory Visit
Admission: RE | Admit: 2024-02-10 | Discharge: 2024-02-10 | Disposition: A | Discharge status: Home / Self Care | Source: Ambulatory Visit | Attending: Nurse Practitioner

## 2024-02-10 ENCOUNTER — Other Ambulatory Visit: Payer: Self-pay | Admitting: Nurse Practitioner

## 2024-02-10 ENCOUNTER — Other Ambulatory Visit (HOSPITAL_COMMUNITY)
Admission: RE | Admit: 2024-02-10 | Discharge: 2024-02-10 | Disposition: A | Discharge status: Home / Self Care | Source: Ambulatory Visit | Attending: Nurse Practitioner | Admitting: Nurse Practitioner

## 2024-02-10 ENCOUNTER — Ambulatory Visit
Admission: RE | Admit: 2024-02-10 | Discharge: 2024-02-10 | Disposition: A | Discharge status: Home / Self Care | Source: Ambulatory Visit | Attending: Nurse Practitioner | Admitting: Nurse Practitioner

## 2024-02-10 DIAGNOSIS — E042 Nontoxic multinodular goiter: Secondary | ICD-10-CM

## 2024-02-16 LAB — CYTOLOGY - NON PAP

## 2024-02-17 LAB — CYTOLOGY - NON PAP

## 2024-02-27 NOTE — Progress Notes (Signed)
 NEUROLOGY CONSULTATION NOTE  Deborah Bolton MRN: [id number] DOB: Oct 10, 1960  Referring provider: Lamar Bare, NP Primary care provider: Lamar Bare, NP  Reason for consult:  intracranial hypertension  Assessment/Plan:   New frequent headaches - MRI findings suggestive of possible increase intracranial hypertension.  May be new recurrence of migraines as well.  Refer for formal dilated eye exam to evaluate for papilledema Pending results of eye exam, plan for lumbar puncture to measure opening CSF pressure and check CSF cell count, protein, glucose, gram stain/culture and cytology.   Further recommendations pending results.   Total time spent in chart and face to face with patient:  48 minutes   Subjective:  Deborah Bolton is 63 year old female with asthma, DM 2, osteoporosis, GERD, overactive bladder and history of left leg weakness/femoral nerve lesion and 1 previous kidney stone who presents for intracranial hypertension.  History supplemented by referring provider's note.   She began experiencing new headaches in May.  It is a pressure behind her eyes or back of her head.  She has chronic asthma which was aggravated by the seasonal allergies.  She was treated with antibiotics without improvement.  Her blood pressure was elevated, in the 160s systolic.  It was treated but headaches persisted.  Headaches are not positional but sometimes wakes up in the morning with them.  She sees wavy lines in her vision but denies blurred vision or episodes of darkening of vision.  She has some photophobia and finds herself squinting, particularly when reading or working at the computer.  She frequently feels dizzy with nausea, like she is carsick.  Denies pulsatile tinnitus.  She had been under a lot of stress as her husband is ill with liver cancer and end stage liver disease and heart disease.    She has remote history of migraines different than these headaches, with nausea, vomiting,  photophobia and phonophobia.    She had an MRI of the brain without contrast on 02/07/2024 which revealed chronic small vessel ischemic changes and mild cerebral atrophy with ex vacuo dilation of the ventricles but also demonstrated partially empty sella with tortuosity of the bilateral optic nerve sheathes and questionable flattening of the left optic nerve head raising possibility of idiopathic intracranial hypertension.     Current medications:  Aleve, Celebrex 100mg  BID Past medications:  Robaxin , Zofran , fluoxetine   PAST MEDICAL HISTORY: Past Medical History:  Diagnosis Date   Arthritis    Asthma    Complication of anesthesia    says she is sensitive to sedating medications   Depression    Diabetes mellitus without complication (HCC)    Dyspnea    History of kidney stones     PAST SURGICAL HISTORY: Past Surgical History:  Procedure Laterality Date   ENDOVEIN HARVEST OF GREATER SAPHENOUS VEIN Left 2011   left leg   JOINT REPLACEMENT Left    hip arthroplasty   MANDIBLE SURGERY  1978   at age 56   TOTAL HIP ARTHROPLASTY Right 10/08/2021   Procedure: RIGHT TOTAL HIP ARTHROPLASTY ANTERIOR APPROACH, INCISIONAL VAC;  Surgeon: Jerri Kay HERO, MD;  Location: [facility] OR;  Service: Orthopedics;  Laterality: Right;  C-3   TOTAL KNEE ARTHROPLASTY Right 03/18/2022   Procedure: RIGHT TOTAL KNEE ARTHROPLASTY;  Surgeon: Jerri Kay HERO, MD;  Location: [facility] OR;  Service: Orthopedics;  Laterality: Right;   TUBAL LIGATION      MEDICATIONS: Current Outpatient Medications on File Prior to Visit  Medication Sig Dispense Refill  albuterol  (PROVENTIL ) (2.5 MG/3ML) 0.083% nebulizer solution Take 2.5 mg by nebulization every 6 (six) hours as needed for wheezing or shortness of breath.     albuterol  (VENTOLIN  HFA) 108 (90 Base) MCG/ACT inhaler Inhale 2 puffs into the lungs every 6 (six) hours as needed for wheezing or shortness of breath.     cefadroxil  (DURICEF) 500 MG capsule Take 1 capsule (500 mg total)  by mouth 2 (two) times daily. 14 capsule 0   celecoxib (CELEBREX) 100 MG capsule Take 100 mg by mouth 2 (two) times daily.     ferrous sulfate  325 (65 FE) MG tablet Take by mouth.     FLUoxetine  (PROZAC ) 20 MG capsule Take 20 mg by mouth at bedtime.     fluticasone  (FLONASE ) 50 MCG/ACT nasal spray Place 1 spray into both nostrils 2 (two) times daily.     metFORMIN (GLUCOPHAGE) 500 MG tablet Take by mouth.     methocarbamol  (ROBAXIN ) 500 MG tablet Take 1 tablet (500 mg total) by mouth 2 (two) times daily as needed. 20 tablet 2   ondansetron  (ZOFRAN ) 4 MG tablet Take 1 tablet (4 mg total) by mouth every 8 (eight) hours as needed for nausea or vomiting. 40 tablet 0   oxybutynin (DITROPAN XL) 15 MG 24 hr tablet Take 15 mg by mouth 2 (two) times daily.     oxyCODONE -acetaminophen  (PERCOCET) 5-325 MG tablet Take 1 tablet by mouth every 8 (eight) hours as needed. 40 tablet 0   pantoprazole  (PROTONIX ) 40 MG tablet Take 40 mg by mouth daily.     TRULICITY  3 MG/0.5ML SOPN Inject 3 mg into the skin every Sunday.     No current facility-administered medications on file prior to visit.    ALLERGIES: Allergies  Allergen Reactions   Iodine     Betadine blisters, chemical burns    Other     General anesthesia - takes a long time to wake up     FAMILY HISTORY: No family history on file.  Objective:  Blood pressure (!) 141/81. General: No acute distress.  Patient appears well-groomed.   Head:  Normocephalic/atraumatic Eyes:  fundi examined but not visualized Neck: supple, no paraspinal tenderness, full range of motion Heart: regular rate and rhythm Neurological Exam: Mental status: alert and oriented to person, place, and time, speech fluent and not dysarthric, language intact. Cranial nerves: CN I: not tested CN II: pupils equal, round and reactive to light, visual fields intact CN III, IV, VI:  full range of motion, no nystagmus, no ptosis CN V: facial sensation intact. CN VII: upper and  lower face symmetric CN VIII: hearing intact CN IX, X: gag intact, uvula midline CN XI: sternocleidomastoid and trapezius muscles intact CN XII: tongue midline Bulk & Tone: normal, no fasciculations. Motor:  muscle strength 3/5 left hip flexion and knee extension, otherwise 5/5 throughout Sensation:  Pinprick and vibratory sensation intact. Deep Tendon Reflexes:  2+ throughout,  toes downgoing.   Finger to nose testing:  Without dysmetria.   Gait:  Mild limp  Romberg negative.    Thank you for allowing me to take part in the care of this patient.  Juliene Dunnings, DO  CC: Lamar Bare, NP

## 2024-03-01 ENCOUNTER — Ambulatory Visit: Admitting: Neurology

## 2024-03-01 ENCOUNTER — Encounter: Payer: Self-pay | Admitting: Neurology

## 2024-03-01 VITALS — BP 141/81

## 2024-03-01 DIAGNOSIS — G8929 Other chronic pain: Secondary | ICD-10-CM

## 2024-03-01 DIAGNOSIS — G932 Benign intracranial hypertension: Secondary | ICD-10-CM

## 2024-03-01 DIAGNOSIS — R519 Headache, unspecified: Secondary | ICD-10-CM | POA: Diagnosis not present

## 2024-03-01 NOTE — Patient Instructions (Signed)
 First formal dilated eye exam with evaluation for papilledema. Then, will order lumbar puncture Further recommendations pending results.

## 2024-03-11 ENCOUNTER — Telehealth: Payer: Self-pay | Admitting: Neurology

## 2024-03-11 DIAGNOSIS — G932 Benign intracranial hypertension: Secondary | ICD-10-CM

## 2024-03-11 NOTE — Telephone Encounter (Signed)
 Reviewed eye exam results from Dr. Dasie Sow.  No evidence of optic nerve edema or concerning visual field defects.  This doesn't completely rule out possibility of increased intracranial pressure.  Would proceed with spinal tap measuring opening pressure and checking CSF for cell count, protein, glucose, cytology and gram stain and culture.  Request that opening pressure be recorded with patient on her side.

## 2024-03-12 ENCOUNTER — Other Ambulatory Visit: Payer: Self-pay | Admitting: Neurology

## 2024-03-12 DIAGNOSIS — G932 Benign intracranial hypertension: Secondary | ICD-10-CM

## 2024-03-12 NOTE — Addendum Note (Signed)
 Addended by: OZELL JESUSA PARAS on: 03/12/2024 11:30 AM   Modules accepted: Orders

## 2024-03-16 NOTE — Discharge Instructions (Signed)

## 2024-03-17 ENCOUNTER — Other Ambulatory Visit (HOSPITAL_COMMUNITY)
Admission: RE | Admit: 2024-03-17 | Discharge: 2024-03-17 | Disposition: A | Discharge status: Home / Self Care | Source: Ambulatory Visit | Attending: Neurology | Admitting: Neurology

## 2024-03-17 ENCOUNTER — Ambulatory Visit: Payer: Self-pay | Admitting: Neurology

## 2024-03-17 ENCOUNTER — Telehealth: Payer: Self-pay | Admitting: Neurology

## 2024-03-17 ENCOUNTER — Ambulatory Visit
Admission: RE | Admit: 2024-03-17 | Discharge: 2024-03-17 | Disposition: A | Discharge status: Home / Self Care | Source: Ambulatory Visit | Attending: Neurology | Admitting: Neurology

## 2024-03-17 DIAGNOSIS — G932 Benign intracranial hypertension: Secondary | ICD-10-CM | POA: Diagnosis present

## 2024-03-17 MED ORDER — TOPIRAMATE 50 MG PO TABS: ORAL_TABLET | ORAL | 5 refills | Status: AC

## 2024-03-17 NOTE — Procedures (Signed)
 PROCEDURE SUMMARY:  Successful image-guided lumbar puncture at level of L4-5.  Opening pressure 23 cm water.  Yielded 16 mL of clear, colorless CSF.  Closing pressure 10 cm water.  No immediate complications.  EBL = trace. Patient tolerated well.   Specimen was sent for labs.  Please see imaging section of Epic for full dictation.    Aengus Sauceda H Cherril Hett PA-C 03/17/2024 8:14 AM

## 2024-03-17 NOTE — Telephone Encounter (Signed)
 Pt called in this afternoon, and she stated that she needs her current list of prescriptions she gave to the nurse when she came in our office.  Pt stated that when she went to [facility] , they gave her a list of old prescriptions that she is no longer taking. Pt wants her prescriptions updated from the list  she gave the nurse here. Thanks

## 2024-03-17 NOTE — Telephone Encounter (Signed)
-----   Message from Juliene Lamar Dunnings sent at 03/17/2024  1:38 PM EDT ----- The spinal fluid pressure is borderline elevated.  I would like to treat headaches with TOPIRAMATE  which can treat for increased intracranial pressure as well as frequent headaches.  Side effects may  include numbness and tingling sensations.  If she is agreeable, please send in prescription for TOPIRAMATE  50MG  TAB - TAKE 1/2 TABLET AT BEDTIME FOR ONE WEEK, THEN INCREASE TO 1 TABLET AT BEDTIME.   QTY 30.  REFILL 0.  If no improvement by time she needs refill, contact us  and we can increase dose.   ----- Message ----- From: Interface, Rad Results In Sent: 03/17/2024   8:49 AM EDT To: Juliene JONELLE Dunnings, DO

## 2024-03-18 LAB — CYTOLOGY - NON PAP

## 2024-03-18 NOTE — Telephone Encounter (Signed)
 Per patient, I have the medication list as it appears but it is incorrect. I gave a hand written listing my current medications to the nurse, in the room at the office visit. I DO NOT take the following medications currently abandoned haven't fir some time period now. Cefadroxil . ferrous sulfate . Fluoxetine . fluticasone . methocarbamol . Oxycodone -acetaminophen . These medications are incorrectly listed as current and should be removed.  Thank you,   Deborah Bolton   List updated.

## 2024-03-18 NOTE — Addendum Note (Signed)
 Addended by: OZELL JESUSA PARAS on: 03/18/2024 11:46 AM   Modules accepted: Orders

## 2024-03-18 NOTE — Telephone Encounter (Signed)
Printed and at the front desk

## 2024-03-19 ENCOUNTER — Telehealth: Payer: Self-pay | Admitting: Neurology

## 2024-03-19 ENCOUNTER — Other Ambulatory Visit: Payer: Self-pay | Admitting: Neurology

## 2024-03-19 ENCOUNTER — Ambulatory Visit
Admission: RE | Admit: 2024-03-19 | Discharge: 2024-03-19 | Disposition: A | Discharge status: Home / Self Care | Source: Ambulatory Visit | Attending: Neurology | Admitting: Neurology

## 2024-03-19 DIAGNOSIS — G971 Other reaction to spinal and lumbar puncture: Secondary | ICD-10-CM

## 2024-03-19 MED ORDER — IOPAMIDOL (ISOVUE-M 200) INJECTION 41%
1.0000 mL | Freq: Once | INTRAMUSCULAR | Status: AC
  Administered 2024-03-19: 1 mL via EPIDURAL

## 2024-03-19 NOTE — Telephone Encounter (Signed)
 Per Dr.Jaffe okay to order blood patch

## 2024-03-19 NOTE — Progress Notes (Signed)
 20cc blood collected from pts Right AC prior to blood patch procedure. Pt tolerated well. 1 successful attempt. Gauze and tape applied after.

## 2024-03-19 NOTE — Telephone Encounter (Signed)
 Per patient she spoke to [facility], She advised them every since her LP she had a constant headache that want let up even when she is laying down. She has nausea and vomiting.   [facility] advised they will contact us  as well as she should call us  to. And they will fit her in if we okay a blood patch.    Please if a Blood patch can be order

## 2024-03-19 NOTE — Telephone Encounter (Signed)
 Pt left a Vm stating that she had a lumbar puncture on Wed and is having problems  the VM cut off so I am not sure what kind of problems

## 2024-03-19 NOTE — Discharge Instructions (Signed)
 Blood Patch Discharge Instructions ? ?Go home and rest quietly for the next 24 hours.  It is important to lie flat for the next 24 hours.  Get up only to go to the restroom.  You may lie in the bed or on a couch on your back, your stomach, your left side or your right side.  You may have one pillow under your head.  You may have pillows between your knees while you are on your side or under your knees while you are on your back. ? ?DO NOT drive today.  Recline the seat as far back as it will go, while still wearing your seat belt, on the way home. ? ?You may get up to go to the bathroom as needed.  You may sit up for 10 minutes to eat.  You may resume your normal diet and medications unless otherwise indicated.  Drink lots of extra fluids today and tomorrow..  ? ?You may resume normal activities after your 24 hours of bed rest is over; however, do not exert yourself strongly or do any heavy lifting tomorrow. ? ?Call your physician for a follow-up appointment.  ? ?If you have any questions  after you arrive home, please call [phone]. ? ?Discharge instructions have been explained to the patient.  The patient, or the person responsible for the patient, fully understands these instructions. ? ?   ?

## 2024-03-27 LAB — PROTEIN, CSF: Total Protein, CSF: 31 mg/dL (ref 15–60)

## 2024-03-27 LAB — CSF CELL COUNT WITH DIFFERENTIAL
RBC Count, CSF: 0 {cells}/uL
TOTAL NUCLEATED CELL: 1 {cells}/uL (ref 0–5)

## 2024-03-27 LAB — CSF CULTURE W GRAM STAIN
MICRO NUMBER:: 16825329
Result:: NO GROWTH
SPECIMEN QUALITY:: ADEQUATE

## 2024-03-27 LAB — GLUCOSE, CSF: Glucose, CSF: 66 mg/dL (ref 40–80)

## 2024-05-21 ENCOUNTER — Telehealth: Payer: Self-pay | Admitting: Neurology

## 2024-05-21 NOTE — Telephone Encounter (Signed)
 Per patient everything okay now. Sh spoke to the Pharmacy in regards to her Topiramate . Wanted to make sure she was getting the correct amount of pills based off the instructions.

## 2024-05-21 NOTE — Telephone Encounter (Signed)
 Caller: Bonnie: self  PH:   Reason for Call: Called in wanting to speak with Dr. Jayme nurse.  FYI: Pt Hung up

## 2024-06-07 ENCOUNTER — Encounter: Payer: Self-pay | Admitting: Radiology

## 2024-11-01 ENCOUNTER — Ambulatory Visit: Admitting: Neurology
